# Patient Record
Sex: Female | Born: 1970 | Race: White | Hispanic: No | Smoking: Former smoker
Health system: Southern US, Community
[De-identification: ages and names within clinical notes are randomized; demographics above are authoritative.]

## PROBLEM LIST (undated history)

## (undated) DIAGNOSIS — Z8742 Personal history of other diseases of the female genital tract: Secondary | ICD-10-CM

## (undated) DIAGNOSIS — E079 Disorder of thyroid, unspecified: Secondary | ICD-10-CM

## (undated) DIAGNOSIS — F32A Depression, unspecified: Secondary | ICD-10-CM

## (undated) HISTORY — DX: Depression, unspecified: F32.A

---
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
## 2006-01-08 ENCOUNTER — Emergency Department: Payer: Self-pay | Admitting: Emergency Medicine

## 2012-06-05 LAB — HM HIV SCREENING LAB: HM HIV Screening: NEGATIVE

## 2013-03-16 ENCOUNTER — Emergency Department: Payer: Self-pay | Admitting: Emergency Medicine

## 2013-03-16 LAB — CBC WITH DIFFERENTIAL/PLATELET
Basophil #: 0.1 10*3/uL (ref 0.0–0.1)
Basophil %: 0.4 %
Eosinophil #: 0.1 10*3/uL (ref 0.0–0.7)
HGB: 15.3 g/dL (ref 12.0–16.0)
Lymphocyte %: 2.3 %
MCH: 29.4 pg (ref 26.0–34.0)
MCHC: 33.1 g/dL (ref 32.0–36.0)
MCV: 89 fL (ref 80–100)
Monocyte %: 4.3 %
Neutrophil %: 92.5 %
RBC: 5.2 10*6/uL (ref 3.80–5.20)
RDW: 13 % (ref 11.5–14.5)
WBC: 12.5 10*3/uL — ABNORMAL HIGH (ref 3.6–11.0)

## 2013-03-16 LAB — MONONUCLEOSIS SCREEN: Mono Test: POSITIVE

## 2013-03-17 LAB — BETA STREP CULTURE(ARMC)

## 2013-11-04 ENCOUNTER — Emergency Department: Payer: Self-pay | Admitting: Emergency Medicine

## 2015-02-10 ENCOUNTER — Emergency Department
Admission: EM | Admit: 2015-02-10 | Discharge: 2015-02-10 | Disposition: A | Discharge status: Home / Self Care | Payer: Self-pay | Attending: Emergency Medicine | Admitting: Emergency Medicine

## 2015-02-10 ENCOUNTER — Encounter: Payer: Self-pay | Admitting: Emergency Medicine

## 2015-02-10 DIAGNOSIS — L03116 Cellulitis of left lower limb: Secondary | ICD-10-CM | POA: Insufficient documentation

## 2015-02-10 DIAGNOSIS — Z720 Tobacco use: Secondary | ICD-10-CM | POA: Insufficient documentation

## 2015-02-10 MED ORDER — CEPHALEXIN 500 MG PO CAPS
500.0000 mg | ORAL_CAPSULE | Freq: Once | ORAL | Status: AC
  Administered 2015-02-10: 500 mg via ORAL

## 2015-02-10 MED ORDER — BACITRACIN 500 UNIT/GM EX OINT
1.0000 "application " | TOPICAL_OINTMENT | Freq: Two times a day (BID) | CUTANEOUS | Status: DC
  Administered 2015-02-10: 1 via TOPICAL

## 2015-02-10 MED ORDER — CEPHALEXIN 500 MG PO CAPS: 500.0000 mg | ORAL_CAPSULE | Freq: Two times a day (BID) | ORAL | Status: DC

## 2015-02-10 MED ORDER — BACITRACIN ZINC 500 UNIT/GM EX OINT
TOPICAL_OINTMENT | CUTANEOUS | Status: AC
  Administered 2015-02-10: 1 via TOPICAL
  Filled 2015-02-10: qty 0.9

## 2015-02-10 MED ORDER — DOUBLE ANTIBIOTIC 500-10000 UNIT/GM EX OINT: TOPICAL_OINTMENT | Freq: Two times a day (BID) | CUTANEOUS | Status: DC

## 2015-02-10 MED ORDER — CEPHALEXIN 500 MG PO CAPS
ORAL_CAPSULE | ORAL | Status: AC
  Administered 2015-02-10: 500 mg via ORAL
  Filled 2015-02-10: qty 1

## 2015-02-10 NOTE — ED Provider Notes (Signed)
[facility] Emergency Department Provider Note  ____________________________________________  Time seen: 4:15 AM  I have reviewed the triage vital signs and the nursing notes.   HISTORY  Chief Complaint Leg Pain      HPI Kristin Page is a 44 y.o. female presents with redness and swelling of tattoo on the left leg that was performed 4 days prior. Patient denies any fever     History reviewed. No pertinent past medical history.  There are no active problems to display for this patient.   History reviewed. No pertinent past surgical history.  No current outpatient prescriptions on file.  Allergies Review of patient's allergies indicates no known allergies.  No family history on file.  Social History History  Substance Use Topics  . Smoking status: Current Some Day Smoker  . Smokeless tobacco: Not on file  . Alcohol Use: No    Review of Systems  Constitutional: Negative for fever. Eyes: Negative for visual changes. ENT: Negative for sore throat. Cardiovascular: Negative for chest pain. Respiratory: Negative for shortness of breath. Gastrointestinal: Negative for abdominal pain, vomiting and diarrhea. Genitourinary: Negative for dysuria. Musculoskeletal: Negative for back pain. Skin: Positive for rash. Neurological: Negative for headaches, focal weakness or numbness.   10-point ROS otherwise negative.  ____________________________________________   PHYSICAL EXAM:  VITAL SIGNS: ED Triage Vitals  Enc Vitals Group     BP 02/10/15 0300 145/101 mmHg     Pulse Rate 02/10/15 0300 78     Resp 02/10/15 0300 18     Temp 02/10/15 0300 98.2 F (36.8 C)     Temp Source 02/10/15 0300 Oral     SpO2 02/10/15 0300 99 %     Weight 02/10/15 0300 312 lb (141.522 kg)     Height 02/10/15 0300 5' 5"  (1.651 m)     Head Cir --      Peak Flow --      Pain Score 02/10/15 0301 4     Pain Loc --      Pain Edu? --      Excl. in [place]? --      Constitutional: Alert and oriented. Well appearing and in no distress. Cardiovascular: Normal rate, regular rhythm. Normal and symmetric distal pulses are present in all extremities. No murmurs, rubs, or gallops. Respiratory: Normal respiratory effort without tachypnea nor retractions. Breath sounds are clear and equal bilaterally. No wheezes/rales/rhonchi. Gastrointestinal: Soft and nontender. No distention. There is no CVA tenderness. Genitourinary: deferred Musculoskeletal: Nontender with normal range of motion in all extremities. No joint effusions.  No lower extremity tenderness nor edema. Neurologic:  Normal speech and language. No gross focal neurologic deficits are appreciated. Speech is normal.   Skin:  Cellulitis of the left lateral distal leg. Edema watch erythema clear drainage Psychiatric: Mood and affect are normal. Speech and behavior are normal. Patient exhibits appropriate insight and judgment.  ____________________________________________       INITIAL IMPRESSION / ASSESSMENT AND PLAN / ED COURSE  Pertinent labs & imaging results that were available during my care of the patient were reviewed by me and considered in my medical decision making (see chart for details).  History and physical exam consistent with an infected tattoo. Keflex 500 mg given by mouth in emergency department  ____________________________________________   FINAL CLINICAL IMPRESSION(S) / ED DIAGNOSES  Final diagnoses:  Cellulitis of leg, left      Gregor Hams, MD 02/10/15 0425

## 2015-02-10 NOTE — ED Notes (Signed)
Patient ambulatory to triage with steady gait, without difficulty or distress noted; pt reports tattoo to left lateral outer leg on Saturday, noted Sunday color was bubbled up; Monday pants were rubbing site; using A&D ointment; but now redness/irritation persists

## 2015-02-10 NOTE — ED Notes (Signed)
Wound dressed per MD order. Pt educated on home care of wound.

## 2015-02-10 NOTE — ED Notes (Signed)
Pt alert and in NAD at time of d/c to husband.

## 2015-02-10 NOTE — Discharge Instructions (Signed)

## 2015-10-12 DIAGNOSIS — E669 Obesity, unspecified: Secondary | ICD-10-CM | POA: Insufficient documentation

## 2015-10-12 DIAGNOSIS — F17210 Nicotine dependence, cigarettes, uncomplicated: Secondary | ICD-10-CM | POA: Insufficient documentation

## 2015-10-12 DIAGNOSIS — Z792 Long term (current) use of antibiotics: Secondary | ICD-10-CM | POA: Insufficient documentation

## 2015-10-12 DIAGNOSIS — J988 Other specified respiratory disorders: Secondary | ICD-10-CM | POA: Insufficient documentation

## 2015-10-13 ENCOUNTER — Emergency Department
Admission: EM | Admit: 2015-10-13 | Discharge: 2015-10-13 | Disposition: A | Discharge status: Home / Self Care | Payer: Medicaid Other | Attending: Emergency Medicine | Admitting: Emergency Medicine

## 2015-10-13 ENCOUNTER — Encounter: Payer: Self-pay | Admitting: Emergency Medicine

## 2015-10-13 ENCOUNTER — Emergency Department: Payer: Self-pay

## 2015-10-13 DIAGNOSIS — J988 Other specified respiratory disorders: Secondary | ICD-10-CM

## 2015-10-13 DIAGNOSIS — R0981 Nasal congestion: Secondary | ICD-10-CM

## 2015-10-13 HISTORY — DX: Disorder of thyroid, unspecified: E07.9

## 2015-10-13 MED ORDER — BENZONATATE 100 MG PO CAPS: 100.0000 mg | ORAL_CAPSULE | Freq: Four times a day (QID) | ORAL | Status: AC | PRN

## 2015-10-13 MED ORDER — ALBUTEROL SULFATE HFA 108 (90 BASE) MCG/ACT IN AERS: INHALATION_SPRAY | RESPIRATORY_TRACT | Status: DC

## 2015-10-13 NOTE — ED Notes (Signed)
Pt presents to ED with c/o frequent productive cough and congestion for the past week. Pt reports that she has noticed her symptoms are getting progressively worse the past few days. Denies fever, n/v/d. Pt currently has no increased work of breathing or acute distress noted. Skin warm and dry.

## 2015-10-13 NOTE — Discharge Instructions (Signed)
You have been seen in the Emergency Department (ED) today for a likely viral illness.  Please drink plenty of clear fluids (water, Gatorade, chicken broth, etc).  You may use Tylenol and/or Motrin according to label instructions.  You can alternate between the two without any side effects.   Please follow up with your doctor as listed above.  Use any provided prescriptions according to the label instructions.  Call your doctor or return to the Emergency Department (ED) if you are unable to tolerate fluids due to vomiting, have worsening trouble breathing, become extremely tired or difficult to awaken, or if you develop any other symptoms that concern you.

## 2015-10-13 NOTE — ED Provider Notes (Signed)
[facility] Emergency Department Provider Note  ____________________________________________  Time seen: Approximately 4:45 AM  I have reviewed the triage vital signs and the nursing notes.   HISTORY  Chief Complaint Cough and Nasal Congestion    HPI Kristin Page is a 45 y.o. female with a past medical history that includes thyroid disease, obesity, and tobacco use who presents with a gradual onset of cough and nasal congestion for about one week.  She states that this happens to her about once a year at this time.  She feels short of breath particularly when she is coughing.  She denies fever/chills, chest pain, abdominal pain, nausea/vomiting.  Her cough is productive of a small amount of clear sputum.  She states that  got worse over a couple of days and she has noticed some wheezing as well which has also happened to her in the past.  She describes symptoms as moderate and nothing is making them better or worse.   Past Medical History  Diagnosis Date  . Thyroid disease     There are no active problems to display for this patient.   Past Surgical History  Procedure Laterality Date  . Cesarean section      Current Outpatient Rx  Name  Route  Sig  Dispense  Refill  . albuterol (PROVENTIL HFA;VENTOLIN HFA) 108 (90 Base) MCG/ACT inhaler      Inhale 2-4 puffs by mouth every 4 hours as needed for wheezing, cough, and/or shortness of breath   1 Inhaler   1   . benzonatate (TESSALON PERLES) 100 MG capsule   Oral   Take 1 capsule (100 mg total) by mouth every 6 (six) hours as needed for cough.   30 capsule   0   . cephALEXin (KEFLEX) 500 MG capsule   Oral   Take 1 capsule (500 mg total) by mouth 2 (two) times daily.   20 capsule   0     Allergies Review of patient's allergies indicates no known allergies.  No family history on file.  Social History Social History  Substance Use Topics  . Smoking status: Current Every Day Smoker --  0.50 packs/day    Types: Cigarettes  . Smokeless tobacco: Never Used  . Alcohol Use: No    Review of Systems Constitutional: No fever/chills Eyes: No visual changes. ENT: No sore throat.  Significant amount of sinus/nasal congestion and drainage Cardiovascular: Denies chest pain. Respiratory: Mild shortness of breath and wheezing.  Frequent minimally productive cough Gastrointestinal: No abdominal pain.  No nausea, no vomiting.  No diarrhea.  No constipation. Genitourinary: Negative for dysuria. Musculoskeletal: Negative for back pain. Skin: Negative for rash. Neurological: Mild intermittent headache.  No focal numbness or weakness  10-point ROS otherwise negative.  ____________________________________________   PHYSICAL EXAM:  VITAL SIGNS: ED Triage Vitals  Enc Vitals Group     BP 10/13/15 0025 130/73 mmHg     Pulse Rate 10/13/15 0025 83     Resp 10/13/15 0025 18     Temp 10/13/15 0025 98 F (36.7 C)     Temp Source 10/13/15 0025 Oral     SpO2 10/13/15 0025 97 %     Weight 10/13/15 0025 250 lb (113.399 kg)     Height 10/13/15 0025 5' 5"  (1.651 m)     Head Cir --      Peak Flow --      Pain Score 10/13/15 0026 8     Pain Loc --  Pain Edu? --      Excl. in [place]? --     Constitutional: Alert and oriented. Well appearing and in no acute distress. Eyes: Conjunctivae are normal. PERRL. EOMI. Head: Atraumatic. Nose: Significant amount of nasal congestion  Mouth/Throat: Mucous membranes are moist.  Oropharynx non-erythematous. Neck: No stridor.  No meningismus Cardiovascular: Normal rate, regular rhythm. Grossly normal heart sounds.  Good peripheral circulation. Respiratory: Normal respiratory effort.  No retractions.  Mild expiratory wheezing in all lung fields. Gastrointestinal: Obese.  Soft and nontender. No distention. No abdominal bruits. No CVA tenderness. Musculoskeletal: No lower extremity tenderness nor edema.  No joint effusions. Neurologic:  Normal speech  and language. No gross focal neurologic deficits are appreciated.  Skin:  Skin is warm, dry and intact. No rash noted. Psychiatric: Mood and affect are normal. Speech and behavior are normal.  ____________________________________________   LABS (all labs ordered are listed, but only abnormal results are displayed)  Labs Reviewed - No data to display ____________________________________________  EKG  None ____________________________________________  RADIOLOGY   Dg Chest 2 View  10/13/2015  CLINICAL DATA:  Acute onset of productive cough and congestion. Initial encounter. EXAM: CHEST  2 VIEW COMPARISON:  Chest radiograph performed 11/04/2013 FINDINGS: The lungs are well-aerated. Mild vascular congestion is noted. There is no evidence of focal opacification, pleural effusion or pneumothorax. The heart is normal in size; the mediastinal contour is within normal limits. No acute osseous abnormalities are seen. IMPRESSION: Mild vascular congestion noted.  Lungs remain grossly clear. Electronically Signed   By: Garald Balding M.D.   On: 10/13/2015 01:33    ____________________________________________   PROCEDURES  Procedure(s) performed: None  Critical Care performed: No ____________________________________________   INITIAL IMPRESSION / ASSESSMENT AND PLAN / ED COURSE  Pertinent labs & imaging results that were available during my care of the patient were reviewed by me and considered in my medical decision making (see chart for details).  Patient's signs and symptoms are consistent with a wheezing associated viral infection.  I am reluctant to give her steroids given that I am afraid this will lower her immune system and may worsen her infection.  However, I believe she would benefit from having an albuterol inhaler.  We discussed the use of a sinus rinse (neti pot), the role that viral infection plays with her symptoms rather than a bacterial infection.  I understand that her  chest x-ray suggested mild vascular congestion but I think that a viral pattern is much more likely in this patient as she has no reason to be volume overloaded.  I gave my usual and customary return precautions.      ____________________________________________  FINAL CLINICAL IMPRESSION(S) / ED DIAGNOSES  Final diagnoses:  Wheezing-associated respiratory infection  Nasal sinus congestion      NEW MEDICATIONS STARTED DURING THIS VISIT:  New Prescriptions   ALBUTEROL (PROVENTIL HFA;VENTOLIN HFA) 108 (90 BASE) MCG/ACT INHALER    Inhale 2-4 puffs by mouth every 4 hours as needed for wheezing, cough, and/or shortness of breath   BENZONATATE (TESSALON PERLES) 100 MG CAPSULE    Take 1 capsule (100 mg total) by mouth every 6 (six) hours as needed for cough.    Hinda Kehr, MD 10/13/15 [phone]

## 2015-10-13 NOTE — ED Notes (Addendum)
Pt with steady gait to room. Pt presents with cough and chest congestion x 1 week. Pt states she has felt SOB. Denies fever. Pt states "when I get to coughing sometimes it gets to burning". Pt denies fatigue, weakness. States small headache.

## 2016-08-14 DIAGNOSIS — L732 Hidradenitis suppurativa: Secondary | ICD-10-CM | POA: Insufficient documentation

## 2016-08-14 LAB — HM PAP SMEAR: HM Pap smear: NEGATIVE

## 2016-08-26 DIAGNOSIS — Z8742 Personal history of other diseases of the female genital tract: Secondary | ICD-10-CM

## 2016-08-26 HISTORY — DX: Personal history of other diseases of the female genital tract: Z87.42

## 2017-03-20 ENCOUNTER — Encounter: Payer: Self-pay | Admitting: Emergency Medicine

## 2017-03-20 DIAGNOSIS — H578 Other specified disorders of eye and adnexa: Secondary | ICD-10-CM | POA: Insufficient documentation

## 2017-03-20 DIAGNOSIS — Z5321 Procedure and treatment not carried out due to patient leaving prior to being seen by health care provider: Secondary | ICD-10-CM | POA: Insufficient documentation

## 2017-03-20 NOTE — ED Triage Notes (Signed)
Patient ambulatory to triage with steady gait, without difficulty or distress noted; pt reports ?allergies, c/o water, itchy eyes x 3 wks

## 2017-03-21 ENCOUNTER — Emergency Department
Admission: EM | Admit: 2017-03-21 | Discharge: 2017-03-21 | Disposition: A | Discharge status: Home / Self Care | Payer: Medicaid Other | Attending: Emergency Medicine | Admitting: Emergency Medicine

## 2019-02-12 ENCOUNTER — Emergency Department
Admission: EM | Admit: 2019-02-12 | Discharge: 2019-02-12 | Disposition: A | Discharge status: Home / Self Care | Payer: 59 | Attending: Emergency Medicine | Admitting: Emergency Medicine

## 2019-02-12 ENCOUNTER — Other Ambulatory Visit: Payer: Self-pay

## 2019-02-12 ENCOUNTER — Emergency Department: Payer: 59

## 2019-02-12 ENCOUNTER — Encounter: Payer: Self-pay | Admitting: Emergency Medicine

## 2019-02-12 DIAGNOSIS — Y939 Activity, unspecified: Secondary | ICD-10-CM | POA: Diagnosis not present

## 2019-02-12 DIAGNOSIS — F17210 Nicotine dependence, cigarettes, uncomplicated: Secondary | ICD-10-CM | POA: Diagnosis not present

## 2019-02-12 DIAGNOSIS — S239XXA Sprain of unspecified parts of thorax, initial encounter: Secondary | ICD-10-CM

## 2019-02-12 DIAGNOSIS — Y92410 Unspecified street and highway as the place of occurrence of the external cause: Secondary | ICD-10-CM | POA: Insufficient documentation

## 2019-02-12 DIAGNOSIS — Y999 Unspecified external cause status: Secondary | ICD-10-CM | POA: Insufficient documentation

## 2019-02-12 DIAGNOSIS — S39012A Strain of muscle, fascia and tendon of lower back, initial encounter: Secondary | ICD-10-CM | POA: Diagnosis not present

## 2019-02-12 DIAGNOSIS — S3992XA Unspecified injury of lower back, initial encounter: Secondary | ICD-10-CM | POA: Diagnosis present

## 2019-02-12 DIAGNOSIS — S29012A Strain of muscle and tendon of back wall of thorax, initial encounter: Secondary | ICD-10-CM | POA: Diagnosis not present

## 2019-02-12 LAB — POC URINE PREG, ED: Preg Test, Ur: NEGATIVE

## 2019-02-12 MED ORDER — CYCLOBENZAPRINE HCL 5 MG PO TABS: 5.0000 mg | ORAL_TABLET | Freq: Three times a day (TID) | ORAL | 0 refills | Status: DC | PRN
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
## 2019-04-20 DIAGNOSIS — L732 Hidradenitis suppurativa: Secondary | ICD-10-CM

## 2019-04-21 ENCOUNTER — Ambulatory Visit: Payer: Self-pay

## 2019-04-21 ENCOUNTER — Other Ambulatory Visit: Payer: Self-pay

## 2019-04-21 ENCOUNTER — Ambulatory Visit (LOCAL_COMMUNITY_HEALTH_CENTER): Payer: 59

## 2019-04-21 VITALS — BP 133/93 | Ht 65.0 in | Wt 300.4 lb

## 2019-04-21 DIAGNOSIS — Z30011 Encounter for initial prescription of contraceptive pills: Secondary | ICD-10-CM | POA: Diagnosis not present

## 2019-04-21 DIAGNOSIS — Z3009 Encounter for other general counseling and advice on contraception: Secondary | ICD-10-CM | POA: Diagnosis not present

## 2019-04-21 LAB — PREGNANCY, URINE: Preg Test, Ur: NEGATIVE

## 2019-04-21 MED ORDER — NORETHINDRONE 0.35 MG PO TABS: 1.0000 | ORAL_TABLET | Freq: Every day | ORAL | 3 refills | Status: DC

## 2019-04-21 MED ORDER — NORETHINDRONE 0.35 MG PO TABS: 1.0000 | ORAL_TABLET | Freq: Every day | ORAL | 11 refills | Status: DC

## 2019-04-21 NOTE — Progress Notes (Signed)
   [place] problem visit  [place] Department  Subjective:  Kristin Page is a 48 y.o. being seen today for   Chief Complaint  Patient presents with  . Contraception    HPI Client here for birth control  Has been on progestin pills in the past.  States that she and partner sometimes use condoms, last sex 04/16/2019 and LMP 03/16/2019.  Client states that her menses have become more irregular, thinks she may be going through menopause   Does the patient have a current or past history of drug use? No   No components found for: HCV]   Health Maintenance Due  Topic Date Due  . HIV Screening  06/19/1987  . TETANUS/TDAP  06/19/1991  . PAP SMEAR-Modifier  06/18/1993    Review of Systems  All other systems reviewed and are negative.   The following portions of the patient's history were reviewed and updated as appropriate: allergies, current medications, past family history, past medical history, past social history, past surgical history and problem list. Problem list updated.   See flowsheet for other program required questions.  Objective:   Vitals:   04/21/19 0917  BP: (!) 133/93  Weight: (!) 300 lb 6.4 oz (136.3 kg)  Height: 5' 5"  (1.651 m)    Physical Exam not done d/t covid.    Assessment and Plan:  Kristin Page is a 48 y.o. female presenting to the [facility] Department for a Women's Health problem visit  There are no diagnoses linked to this encounter. PT today.  IF negative may start POPs today. Co to do OTC PT in 1 wk. Micronor take 1 tablet daily # 3 packs.  Use condoms as back-up x 1 wk. Co to call when starts 4th pack to schedule to complete RP and/or continuation of POPs.   Client will continue her primary care with PCP for hypothyroid, blood pressure and weight management.    No follow-ups on file.  No future appointments.  Hassell Done, FNP

## 2019-04-21 NOTE — Progress Notes (Signed)
Here today to restart birth control. Desires Ocp's. Declines bloodwork and condoms today.  Hal Morales, RN
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
## 2020-02-09 ENCOUNTER — Ambulatory Visit: Payer: 59

## 2020-03-23 ENCOUNTER — Other Ambulatory Visit: Payer: Self-pay

## 2020-03-23 ENCOUNTER — Encounter: Payer: Self-pay | Admitting: Physician Assistant

## 2020-03-23 ENCOUNTER — Ambulatory Visit (LOCAL_COMMUNITY_HEALTH_CENTER): Payer: 59 | Admitting: Physician Assistant

## 2020-03-23 VITALS — BP 129/89 | Ht 65.0 in | Wt 304.0 lb

## 2020-03-23 DIAGNOSIS — Z3009 Encounter for other general counseling and advice on contraception: Secondary | ICD-10-CM

## 2020-03-23 DIAGNOSIS — Z30011 Encounter for initial prescription of contraceptive pills: Secondary | ICD-10-CM

## 2020-03-23 DIAGNOSIS — Z0000 Encounter for general adult medical examination without abnormal findings: Secondary | ICD-10-CM | POA: Diagnosis not present

## 2020-03-23 DIAGNOSIS — Z3041 Encounter for surveillance of contraceptive pills: Secondary | ICD-10-CM

## 2020-03-23 MED ORDER — NORETHINDRONE 0.35 MG PO TABS: 1.0000 | ORAL_TABLET | Freq: Every day | ORAL | 12 refills | Status: DC

## 2020-03-23 NOTE — Progress Notes (Signed)
Needs to get OCP rx filled at [facility] on [address] (other meds at [facility]), due to specific manufacturer. Has not missed pills or taken them late.

## 2020-03-24 ENCOUNTER — Encounter: Payer: Self-pay | Admitting: Physician Assistant

## 2020-03-24 NOTE — Progress Notes (Signed)
Family Planning Visit- Repeat Yearly Visit  Subjective:  Kristin Page is a 49 y.o. [phone]  being seen today for an well woman visit and to discuss family planning options.    She is currently using oral progesterone-only contraceptive for pregnancy prevention. Patient reports she does not want a pregnancy in the next year. Patient  has Hidradenitis suppurativa on their problem list.  Chief Complaint  Patient presents with  . Contraception    PE and OCs    Patient reports that she has had COVID and has been experiencing more frequent headaches since then.  Reports that she has run out of her thyroid medicine and needs an appointment with PCP to restart.  States that she has noticed in increase in her weight since she has been off of her medicine.  PHQ-9= 13 and patient denies S or H ideation or plan and states that it is due to stressors related to being out of work during [employer] and worrying about paying bills.  Declines LCSW referral but accepts LCSW and [employer] cards to use if changes mind.  Denies blurred vision, chest or arm pain and states that when she takes BP at her mother's house it is normal.   Patient denies any changes to her personal or family history or other concerns except as noted above.   See flowsheet for other program required questions.   Body mass index is 50.59 kg/m. - Patient is eligible for diabetes screening based on BMI and age >16?  yes HA1C ordered? No, patient declines and states that her PCP will do this test.  Patient reports 1 of partners in last year. Desires STI screening?  No - declines.   Has patient been screened once for HCV in the past?  No  No results found for: HCVAB  Does the patient have current of drug use, have a partner with drug use, and/or has been incarcerated since last result? No  If yes-- Screen for HCV through [facility]   Does the patient meet criteria for HBV testing? No  Criteria:  -Household, sexual or needle sharing  contact with HBV -History of drug use -HIV positive -Those with known Hep C   Health Maintenance Due  Topic Date Due  . Hepatitis C Screening  Never done  . COVID-19 Vaccine (1) Never done  . TETANUS/TDAP  Never done  . PAP SMEAR-Modifier  08/15/2019    Review of Systems  All other systems reviewed and are negative.   The following portions of the patient's history were reviewed and updated as appropriate: allergies, current medications, past family history, past medical history, past social history, past surgical history and problem list. Problem list updated.  Objective:   Vitals:   03/23/20 1312 03/23/20 1315  BP: (!) 154/97 129/89  Weight: (!) 304 lb (137.9 kg)   Height: 5' 5"  (1.651 m)     Physical Exam Vitals and nursing note reviewed.  Constitutional:      General: She is not in acute distress.    Appearance: Normal appearance. She is obese.  HENT:     Head: Normocephalic and atraumatic.  Eyes:     Conjunctiva/sclera: Conjunctivae normal.  Neck:     Thyroid: No thyroid mass, thyromegaly or thyroid tenderness.  Cardiovascular:     Rate and Rhythm: Normal rate and regular rhythm.  Pulmonary:     Effort: Pulmonary effort is normal.     Breath sounds: Normal breath sounds.  Chest:     Breasts:  Right: Normal.        Left: Normal.  Abdominal:     Palpations: Abdomen is soft. There is no mass.     Tenderness: There is no abdominal tenderness. There is no guarding or rebound.  Musculoskeletal:     Cervical back: Neck supple. No tenderness.  Lymphadenopathy:     Cervical: No cervical adenopathy.     Upper Body:     Right upper body: No supraclavicular, axillary or pectoral adenopathy.     Left upper body: No supraclavicular, axillary or pectoral adenopathy.  Neurological:     Mental Status: She is alert and oriented to person, place, and time.  Psychiatric:        Mood and Affect: Mood normal.        Behavior: Behavior normal.        Thought  Content: Thought content normal.        Judgment: Judgment normal.       Assessment and Plan:  Kristin Page is a 49 y.o. female [phone] presenting to the [facility] Department for an yearly well woman exam/family planning visit  Contraception counseling: Reviewed all forms of birth control options in the tiered based approach. available including abstinence; over the counter/barrier methods; hormonal contraceptive medication including pill, patch, ring, injection,contraceptive implant, ECP; hormonal and nonhormonal IUDs; permanent sterilization options including vasectomy and the various tubal sterilization modalities. Risks, benefits, and typical effectiveness rates were reviewed.  Questions were answered.  Written information was also given to the patient to review.  Patient desires to continue with her current OC, this was prescribed for patient. She will follow up in  1 year and prn for surveillance.  She was told to call with any further questions, or with any concerns about this method of contraception.  Emphasized use of condoms 100% of the time for STI prevention.  Patient was not a candidate for ECP today.   1. Encounter for counseling regarding contraception Counseled patient when to call clinic with concerns about SE from OCs. Rec condoms with all sex.  2. Well woman exam (no gynecological exam) Reviewed with patient healthy habits to lose weight. Rec MVI 1 po daily. Enc to follow up with/establish with PCP for follow up on thyroid disorder, elevated BP, headaches, illness and age appropriate screenings. Patient give LCSW and [employer] cards.  3. Surveillance of previously prescribed contraceptive pill E-Rx for Norethindrone 28d 1 po daily sent to patient's pharmacy of choice. - norethindrone (MICRONOR) 0.35 MG tablet; Take 1 tablet (0.35 mg total) by mouth daily.  Dispense: 28 tablet; Refill: 12   Patient left clinic after being given cards and did not stay  for posting by RN.  No follow-ups on file.  No future appointments.  Jerene Dilling, PA

## 2020-07-27 ENCOUNTER — Telehealth: Payer: Self-pay

## 2020-07-27 NOTE — Telephone Encounter (Signed)
Copied from [place] [id number]. Topic: Appointment Scheduling - New Patient >> Jul 27, 2020  9:35 AM Lennox Solders wrote: New patient has been scheduled for your office. Provider: Fabio Bering Date of Appointment:08-09-2020  Route to department's PEC pool.

## 2020-08-09 ENCOUNTER — Encounter: Payer: Self-pay | Admitting: Physician Assistant

## 2020-08-09 ENCOUNTER — Other Ambulatory Visit: Payer: Self-pay

## 2020-08-09 ENCOUNTER — Ambulatory Visit (INDEPENDENT_AMBULATORY_CARE_PROVIDER_SITE_OTHER): Payer: 59 | Admitting: Physician Assistant

## 2020-08-09 VITALS — BP 121/85 | HR 75 | Temp 98.3°F | Ht 65.0 in | Wt 323.3 lb

## 2020-08-09 DIAGNOSIS — R7303 Prediabetes: Secondary | ICD-10-CM

## 2020-08-09 DIAGNOSIS — E785 Hyperlipidemia, unspecified: Secondary | ICD-10-CM

## 2020-08-09 DIAGNOSIS — E039 Hypothyroidism, unspecified: Secondary | ICD-10-CM

## 2020-08-09 DIAGNOSIS — R87619 Unspecified abnormal cytological findings in specimens from cervix uteri: Secondary | ICD-10-CM

## 2020-08-09 DIAGNOSIS — F32A Depression, unspecified: Secondary | ICD-10-CM

## 2020-08-09 DIAGNOSIS — R0981 Nasal congestion: Secondary | ICD-10-CM

## 2020-08-09 DIAGNOSIS — Z114 Encounter for screening for human immunodeficiency virus [HIV]: Secondary | ICD-10-CM

## 2020-08-09 DIAGNOSIS — T485X5A Adverse effect of other anti-common-cold drugs, initial encounter: Secondary | ICD-10-CM

## 2020-08-09 DIAGNOSIS — Z1159 Encounter for screening for other viral diseases: Secondary | ICD-10-CM

## 2020-08-09 DIAGNOSIS — R29818 Other symptoms and signs involving the nervous system: Secondary | ICD-10-CM

## 2020-08-09 DIAGNOSIS — G5603 Carpal tunnel syndrome, bilateral upper limbs: Secondary | ICD-10-CM

## 2020-08-09 MED ORDER — PREDNISONE 20 MG PO TABS: ORAL_TABLET | ORAL | 0 refills | Status: DC
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
## 2020-08-09 NOTE — Patient Instructions (Signed)
Health Maintenance, Female Adopting a healthy lifestyle and getting preventive care are important in promoting health and wellness. Ask your health care provider about:  The right schedule for you to have regular tests and exams.  Things you can do on your own to prevent diseases and keep yourself healthy. What should I know about diet, weight, and exercise? Eat a healthy diet   Eat a diet that includes plenty of vegetables, fruits, low-fat dairy products, and lean protein.  Do not eat a lot of foods that are high in solid fats, added sugars, or sodium. Maintain a healthy weight Body mass index (BMI) is used to identify weight problems. It estimates body fat based on height and weight. Your health care provider can help determine your BMI and help you achieve or maintain a healthy weight. Get regular exercise Get regular exercise. This is one of the most important things you can do for your health. Most adults should:  Exercise for at least 150 minutes each week. The exercise should increase your heart rate and make you sweat (moderate-intensity exercise).  Do strengthening exercises at least twice a week. This is in addition to the moderate-intensity exercise.  Spend less time sitting. Even light physical activity can be beneficial. Watch cholesterol and blood lipids Have your blood tested for lipids and cholesterol at 48 years of age, then have this test every 5 years. Have your cholesterol levels checked more often if:  Your lipid or cholesterol levels are high.  You are older than 48 years of age.  You are at high risk for heart disease. What should I know about cancer screening? Depending on your health history and family history, you may need to have cancer screening at various ages. This may include screening for:  Breast cancer.  Cervical cancer.  Colorectal cancer.  Skin cancer.  Lung cancer. What should I know about heart disease, diabetes, and high blood  pressure? Blood pressure and heart disease  High blood pressure causes heart disease and increases the risk of stroke. This is more likely to develop in people who have high blood pressure readings, are of African descent, or are overweight.  Have your blood pressure checked: ? Every 3-5 years if you are 18-39 years of age. ? Every year if you are 40 years old or older. Diabetes Have regular diabetes screenings. This checks your fasting blood sugar level. Have the screening done:  Once every three years after age 40 if you are at a normal weight and have a low risk for diabetes.  More often and at a younger age if you are overweight or have a high risk for diabetes. What should I know about preventing infection? Hepatitis B If you have a higher risk for hepatitis B, you should be screened for this virus. Talk with your health care provider to find out if you are at risk for hepatitis B infection. Hepatitis C Testing is recommended for:  Everyone born from 1945 through 1965.  Anyone with known risk factors for hepatitis C. Sexually transmitted infections (STIs)  Get screened for STIs, including gonorrhea and chlamydia, if: ? You are sexually active and are younger than 48 years of age. ? You are older than 48 years of age and your health care provider tells you that you are at risk for this type of infection. ? Your sexual activity has changed since you were last screened, and you are at increased risk for chlamydia or gonorrhea. Ask your health care provider if   you are at risk.  Ask your health care provider about whether you are at high risk for HIV. Your health care provider may recommend a prescription medicine to help prevent HIV infection. If you choose to take medicine to prevent HIV, you should first get tested for HIV. You should then be tested every 3 months for as long as you are taking the medicine. Pregnancy  If you are about to stop having your period (premenopausal) and  you may become pregnant, seek counseling before you get pregnant.  Take 400 to 800 micrograms (mcg) of folic acid every day if you become pregnant.  Ask for birth control (contraception) if you want to prevent pregnancy. Osteoporosis and menopause Osteoporosis is a disease in which the bones lose minerals and strength with aging. This can result in bone fractures. If you are 65 years old or older, or if you are at risk for osteoporosis and fractures, ask your health care provider if you should:  Be screened for bone loss.  Take a calcium or vitamin D supplement to lower your risk of fractures.  Be given hormone replacement therapy (HRT) to treat symptoms of menopause. Follow these instructions at home: Lifestyle  Do not use any products that contain nicotine or tobacco, such as cigarettes, e-cigarettes, and chewing tobacco. If you need help quitting, ask your health care provider.  Do not use street drugs.  Do not share needles.  Ask your health care provider for help if you need support or information about quitting drugs. Alcohol use  Do not drink alcohol if: ? Your health care provider tells you not to drink. ? You are pregnant, may be pregnant, or are planning to become pregnant.  If you drink alcohol: ? Limit how much you use to 0-1 drink a day. ? Limit intake if you are breastfeeding.  Be aware of how much alcohol is in your drink. In the [place] one drink equals one 12 oz bottle of beer (355 mL), one 5 oz glass of wine (148 mL), or one 1 oz glass of hard liquor (44 mL). General instructions  Schedule regular health, dental, and eye exams.  Stay current with your vaccines.  Tell your health care provider if: ? You often feel depressed. ? You have ever been abused or do not feel safe at home. Summary  Adopting a healthy lifestyle and getting preventive care are important in promoting health and wellness.  Follow your health care provider's instructions about healthy  diet, exercising, and getting tested or screened for diseases.  Follow your health care provider's instructions on monitoring your cholesterol and blood pressure. This information is not intended to replace advice given to you by your health care provider. Make sure you discuss any questions you have with your health care provider. Document Revised: 09/03/2018 Document Reviewed: 09/03/2018 Elsevier Patient Education  2020 [employer]vier [employer]  

## 2020-08-10 ENCOUNTER — Telehealth: Payer: Self-pay | Admitting: Physician Assistant

## 2020-08-10 DIAGNOSIS — E785 Hyperlipidemia, unspecified: Secondary | ICD-10-CM | POA: Insufficient documentation

## 2020-08-10 DIAGNOSIS — R29818 Other symptoms and signs involving the nervous system: Secondary | ICD-10-CM | POA: Insufficient documentation

## 2020-08-10 DIAGNOSIS — G5603 Carpal tunnel syndrome, bilateral upper limbs: Secondary | ICD-10-CM | POA: Insufficient documentation

## 2020-08-10 DIAGNOSIS — E039 Hypothyroidism, unspecified: Secondary | ICD-10-CM | POA: Insufficient documentation

## 2020-08-10 DIAGNOSIS — R7303 Prediabetes: Secondary | ICD-10-CM | POA: Insufficient documentation

## 2020-08-10 DIAGNOSIS — R87619 Unspecified abnormal cytological findings in specimens from cervix uteri: Secondary | ICD-10-CM | POA: Insufficient documentation

## 2020-08-10 DIAGNOSIS — F32A Depression, unspecified: Secondary | ICD-10-CM | POA: Insufficient documentation

## 2020-08-10 LAB — COMPREHENSIVE METABOLIC PANEL
ALT: 14 IU/L (ref 0–32)
AST: 39 IU/L (ref 0–40)
Albumin/Globulin Ratio: 1.4 (ref 1.2–2.2)
Albumin: 4.4 g/dL (ref 3.8–4.8)
Alkaline Phosphatase: 55 IU/L (ref 44–121)
BUN/Creatinine Ratio: 8 — ABNORMAL LOW (ref 9–23)
BUN: 12 mg/dL (ref 6–24)
Bilirubin Total: 0.3 mg/dL (ref 0.0–1.2)
CO2: 25 mmol/L (ref 20–29)
Calcium: 9.3 mg/dL (ref 8.7–10.2)
Chloride: 99 mmol/L (ref 96–106)
Creatinine, Ser: 1.43 mg/dL — ABNORMAL HIGH (ref 0.57–1.00)
GFR calc Af Amer: 50 mL/min/{1.73_m2} — ABNORMAL LOW (ref >59)
GFR calc non Af Amer: 43 mL/min/{1.73_m2} — ABNORMAL LOW (ref >59)
Globulin, Total: 3.1 g/dL (ref 1.5–4.5)
Glucose: 94 mg/dL (ref 65–99)
Potassium: 4.6 mmol/L (ref 3.5–5.2)
Sodium: 139 mmol/L (ref 134–144)
Total Protein: 7.5 g/dL (ref 6.0–8.5)

## 2020-08-10 LAB — CBC WITH DIFFERENTIAL/PLATELET
Basophils Absolute: 0.1 10*3/uL (ref 0.0–0.2)
Basos: 1 %
EOS (ABSOLUTE): 0.4 10*3/uL (ref 0.0–0.4)
Eos: 5 %
Hematocrit: 37.4 % (ref 34.0–46.6)
Hemoglobin: 12.6 g/dL (ref 11.1–15.9)
Immature Grans (Abs): 0 10*3/uL (ref 0.0–0.1)
Immature Granulocytes: 0 %
Lymphocytes Absolute: 2.2 10*3/uL (ref 0.7–3.1)
Lymphs: 27 %
MCH: 31.8 pg (ref 26.6–33.0)
MCHC: 33.7 g/dL (ref 31.5–35.7)
MCV: 94 fL (ref 79–97)
Monocytes Absolute: 0.5 10*3/uL (ref 0.1–0.9)
Monocytes: 6 %
Neutrophils Absolute: 4.7 10*3/uL (ref 1.4–7.0)
Neutrophils: 61 %
Platelets: 340 10*3/uL (ref 150–450)
RBC: 3.96 x10E6/uL (ref 3.77–5.28)
RDW: 13.2 % (ref 11.7–15.4)
WBC: 7.8 10*3/uL (ref 3.4–10.8)

## 2020-08-10 LAB — HEMOGLOBIN A1C
Est. average glucose Bld gHb Est-mCnc: 126 mg/dL
Hgb A1c MFr Bld: 6 % — ABNORMAL HIGH (ref 4.8–5.6)

## 2020-08-10 LAB — LIPID PANEL
Chol/HDL Ratio: 7 ratio — ABNORMAL HIGH (ref 0.0–4.4)
Cholesterol, Total: 308 mg/dL — ABNORMAL HIGH (ref 100–199)
HDL: 44 mg/dL (ref >39)
LDL Chol Calc (NIH): 232 mg/dL — ABNORMAL HIGH (ref 0–99)
Triglycerides: 166 mg/dL — ABNORMAL HIGH (ref 0–149)
VLDL Cholesterol Cal: 32 mg/dL (ref 5–40)

## 2020-08-10 LAB — TSH: TSH: 152 u[IU]/mL — ABNORMAL HIGH (ref 0.450–4.500)

## 2020-08-10 LAB — HEPATITIS C ANTIBODY: Hep C Virus Ab: <0.1 s/co ratio (ref 0.0–0.9)

## 2020-08-10 LAB — HIV ANTIBODY (ROUTINE TESTING W REFLEX): HIV Screen 4th Generation wRfx: NONREACTIVE

## 2020-08-10 MED ORDER — LEVOTHYROXINE SODIUM 150 MCG PO TABS: 150.0000 ug | ORAL_TABLET | Freq: Every day | ORAL | 0 refills | Status: DC

## 2020-08-10 MED ORDER — LEVOTHYROXINE SODIUM 50 MCG PO TABS: 50.0000 ug | ORAL_TABLET | Freq: Every day | ORAL | 0 refills | Status: DC

## 2020-08-10 NOTE — Telephone Encounter (Signed)
I accidentally send in 50 mcg synthroid instead of 150 mcg. I have sent in the correct 150 mcg dose so can we please call and cancel the 50 mcg dose? Thank you.

## 2020-08-10 NOTE — Telephone Encounter (Signed)
Called pharmacy and cancelled synthroid 50 mcg.

## 2020-09-28 NOTE — Progress Notes (Signed)
Complete physical exam   Patient: Kristin Page   DOB: 1970/12/18   50 y.o. Female  MRN: [id number] Visit Date: 09/29/2020  Today's healthcare provider: Trinna Post, PA-C   Chief Complaint  Patient presents with  . Annual Exam  . Hypothyroidism  I,Gordie Crumby M Jona Erkkila,acting as a scribe for Trinna Post, PA-C.,have documented all relevant documentation on the behalf of Trinna Post, PA-C,as directed by  Trinna Post, PA-C while in the presence of Trinna Post, PA-C.  Subjective    [name] is a 50 y.o. female who presents today for a complete physical exam.  She reports consuming a general diet. The patient has a physically strenuous job, but has no regular exercise apart from work.  She generally feels well. She reports sleeping fairly well. She does have additional problems to discuss today.  HPI   In the interim, reports her mom has passed away of an infection.  She continues to use Afrin nasal spray.   Patient also notes that her sexual partner was positive for gonorrhea and alerted her of this recently. She does not have any additional sexual partners. She reports they engage in mostly oral sex .She had a sore throat earlier this year but was seen at urgent care and was strep positive. She was treated with amoxicillin with good resolution of symptoms. She does not have any vaginal discharge, pain, of bleeding. She is interested in STI testing.   Hypothyroid, follow-up  Lab Results  Component Value Date   TSH 4.330 09/29/2020   TSH 152.000 (H) 08/09/2020   Wt Readings from Last 3 Encounters:  09/29/20 295 lb (133.8 kg)  08/09/20 (!) 323 lb 4.8 oz (146.6 kg)  03/23/20 (!) 304 lb (137.9 kg)    She was last seen for hypothyroid 2 months ago.  Management since that visit includes started Synthroid 150 MCG daily. She reports good compliance with treatment. She is not having side effects. She reports her condition has improved.    Symptoms: No change in energy level No constipation  No diarrhea No heat / cold intolerance  No nervousness No palpitations  No weight changes    -----------------------------------------------------------------------------------------   Past Medical History:  Diagnosis Date  . History of abnormal cervical Pap smear 08/26/2016  . Thyroid disease    Past Surgical History:  Procedure Laterality Date  . CESAREAN SECTION     Social History   Socioeconomic History  . Marital status: Divorced    Spouse name: Not on file  . Number of children: Not on file  . Years of education: Not on file  . Highest education level: Not on file  Occupational History  . Not on file  Tobacco Use  . Smoking status: Former Smoker    Packs/day: 0.50    Types: Cigarettes    Quit date: 12/24/2018    Years since quitting: 1.7  . Smokeless tobacco: Never Used  Vaping Use  . Vaping Use: Former  . Devices: used to help her stop smoking  Substance and Sexual Activity  . Alcohol use: Yes    Comment: occasionally  . Drug use: Never  . Sexual activity: Yes    Birth control/protection: Pill  Other Topics Concern  . Not on file  Social History Narrative  . Not on file   Social Determinants of Health   Financial Resource Strain: Not on file  Food Insecurity: Not on file  Transportation Needs: Not on file  Physical Activity:  Not on file  Stress: Not on file  Social Connections: Not on file  Intimate Partner Violence: Not At Risk  . Fear of Current or Ex-Partner: No  . Emotionally Abused: No  . Physically Abused: No  . Sexually Abused: No   Family Status  Relation Name Status  . Mother  Alive  . Father  Deceased  . MGM  Deceased  . MGF  Deceased  . PGF unknown (Not Specified)  . PGM unknown (Not Specified)  . Son  Alive  . Daughter  Alive  . Daughter  Alive  . H Brother  Alive  . H Sister  Alive   Family History  Problem Relation Age of Onset  . Heart disease Mother   .  Depression Mother   . Cervical cancer Mother   . Multiple sclerosis Mother   . Anxiety disorder Mother   . Heart disease Father   . Hypertension Maternal Grandmother   . Parkinson's disease Maternal Grandmother   . Dementia Maternal Grandmother   . Hypertension Maternal Grandfather   . Stroke Maternal Grandfather   . ADD / ADHD Son   . ADD / ADHD Daughter    No Known Allergies  Patient Care Team: Paulene Floor as PCP - General (Physician Assistant)   Medications: Outpatient Medications Prior to Visit  Medication Sig  . cholecalciferol (VITAMIN D3) 25 MCG (1000 UNIT) tablet Take 5,000 Units by mouth daily.  . fexofenadine (ALLEGRA) 180 MG tablet Take 180 mg by mouth daily.  Marland Kitchen levothyroxine (SYNTHROID) 150 MCG tablet Take 1 tablet (150 mcg total) by mouth daily.  . Multiple Vitamins-Minerals (WOMENS MULTIVITAMIN PO) Take 1 tablet by mouth 1 day or 1 dose.  . norethindrone (MICRONOR) 0.35 MG tablet Take 1 tablet (0.35 mg total) by mouth daily.  . norethindrone (ORTHO MICRONOR) 0.35 MG tablet Take 1 tablet (0.35 mg total) by mouth daily.  . predniSONE (DELTASONE) 20 MG tablet Take 6 pills on day 1, 5 pills on day 2 and so on until complete. (Patient not taking: Reported on 09/29/2020)   No facility-administered medications prior to visit.    Review of Systems  Constitutional: Positive for fatigue.  HENT: Negative.   Eyes: Negative.   Respiratory: Negative.   Cardiovascular: Negative.   Gastrointestinal: Negative.   Endocrine: Negative.   Genitourinary: Negative.   Musculoskeletal: Positive for arthralgias, back pain and joint swelling.  Skin: Negative.   Allergic/Immunologic: Negative.   Neurological: Negative.   Hematological: Negative.   Psychiatric/Behavioral: Negative.       Objective    BP 132/85 (BP Location: Left Arm, Patient Position: Sitting, Cuff Size: Large)   Pulse 87   Temp 98.3 F (36.8 C) (Oral)   Ht 5' 5"  (1.651 m)   Wt 295 lb (133.8 kg)    LMP 09/09/2020 (Exact Date)   SpO2 100%   BMI 49.09 kg/m    Physical Exam Constitutional:      Appearance: Normal appearance.  HENT:     Right Ear: Tympanic membrane, ear canal and external ear normal.     Left Ear: Tympanic membrane, ear canal and external ear normal.  Cardiovascular:     Rate and Rhythm: Normal rate and regular rhythm.     Pulses: Normal pulses.     Heart sounds: Normal heart sounds.  Pulmonary:     Effort: Pulmonary effort is normal.     Breath sounds: Normal breath sounds.  Abdominal:     General: Abdomen is flat.  Bowel sounds are normal.     Palpations: Abdomen is soft.  Genitourinary:    Vagina: Normal.     Cervix: Normal.  Skin:    General: Skin is warm and dry.  Neurological:     General: No focal deficit present.     Mental Status: She is alert and oriented to person, place, and time.  Psychiatric:        Mood and Affect: Mood normal.        Behavior: Behavior normal.       Last depression screening scores PHQ 2/9 Scores 09/29/2020 08/09/2020 03/23/2020  PHQ - 2 Score 2 2 4   PHQ- 9 Score 4 6 13    Last fall risk screening Fall Risk  09/29/2020  Falls in the past year? 0  Number falls in past yr: 0  Injury with Fall? 0  Risk for fall due to : No Fall Risks  Follow up Falls evaluation completed   Last Audit-C alcohol use screening Alcohol Use Disorder Test (AUDIT) 09/29/2020  1. How often do you have a drink containing alcohol? 3  2. How many drinks containing alcohol do you have on a typical day when you are drinking? 0  3. How often do you have six or more drinks on one occasion? 0  AUDIT-C Score 3  4. How often during the last year have you found that you were not able to stop drinking once you had started? 0  5. How often during the last year have you failed to do what was normally expected from you because of drinking? 0  6. How often during the last year have you needed a first drink in the morning to get yourself going after a heavy  drinking session? 0  7. How often during the last year have you had a feeling of guilt of remorse after drinking? 0  8. How often during the last year have you been unable to remember what happened the night before because you had been drinking? 0  9. Have you or someone else been injured as a result of your drinking? 0  10. Has a relative or friend or a doctor or another health worker been concerned about your drinking or suggested you cut down? 0  Alcohol Use Disorder Identification Test Final Score (AUDIT) 3   A score of 3 or more in women, and 4 or more in men indicates increased risk for alcohol abuse, EXCEPT if all of the points are from question 1   Results for orders placed or performed in visit on 09/29/20  TSH  Result Value Ref Range   TSH 4.330 0.450 - 4.500 uIU/mL  Molecular Ancillary Only  Result Value Ref Range   Neisseria Gonorrhea Negative    Chlamydia Negative    Comment Normal Reference Ranger Chlamydia - Negative    Comment      Normal Reference Range Neisseria Gonorrhea - Negative    Assessment & Plan    Routine Health Maintenance and Physical Exam  Exercise Activities and Dietary recommendations Goals   None      There is no immunization history on file for this patient.  Health Maintenance  Topic Date Due  . TETANUS/TDAP  Never done  . COLONOSCOPY (Pts 45-2yr Insurance coverage will need to be confirmed)  Never done  . COVID-19 Vaccine (1) 10/15/2020 (Originally 06/18/1976)  . INFLUENZA VACCINE  12/22/2020 (Originally 04/24/2020)  . PAP SMEAR-Modifier  08/14/2021  . Hepatitis C Screening  Completed  .  HIV Screening  Completed    Discussed health benefits of physical activity, and encouraged her to engage in regular exercise appropriate for her age and condition.  1. Annual physical exam   2. Hypothyroidism, unspecified type  TSH now within normal range, continue synthroid 150 mg daily.   - TSH  3. Depression, unspecified depression  type  She reports this has improved, does not want to pursue counseling currently.   4. Morbid obesity ([place])  Has lost 30 lbs since starting synthroid.   5. Need for tetanus booster   6. Nasal congestion due to prolonged use of decongestants  Continues to use afrin nasal spray. Counseled on risks of this.  7. Colon cancer screening  - Cologuard  8. Exposure to gonorrhea  - GC/Chlamydia Probe Amp  9. Cervical cancer screening  - Cytology - PAP   No follow-ups on file.     ITrinna Post, PA-C, have reviewed all documentation for this visit. The documentation on 10/04/20 for the exam, diagnosis, procedures, and orders are all accurate and complete.  The entirety of the information documented in the History of Present Illness, Review of Systems and Physical Exam were personally obtained by me. Portions of this information were initially documented by [facility] and reviewed by me for thoroughness and accuracy.     Paulene Floor  [facility] [phone] (phone) [phone] (fax)  [place]

## 2020-09-29 ENCOUNTER — Ambulatory Visit (INDEPENDENT_AMBULATORY_CARE_PROVIDER_SITE_OTHER): Payer: 59 | Admitting: Physician Assistant

## 2020-09-29 ENCOUNTER — Encounter: Payer: Self-pay | Admitting: Physician Assistant

## 2020-09-29 ENCOUNTER — Other Ambulatory Visit: Payer: Self-pay

## 2020-09-29 ENCOUNTER — Other Ambulatory Visit (HOSPITAL_COMMUNITY)
Admission: RE | Admit: 2020-09-29 | Discharge: 2020-09-29 | Disposition: A | Discharge status: Home / Self Care | Payer: 59 | Source: Ambulatory Visit | Attending: Physician Assistant | Admitting: Physician Assistant

## 2020-09-29 VITALS — BP 132/85 | HR 87 | Temp 98.3°F | Ht 65.0 in | Wt 295.0 lb

## 2020-09-29 DIAGNOSIS — R0981 Nasal congestion: Secondary | ICD-10-CM | POA: Diagnosis not present

## 2020-09-29 DIAGNOSIS — Z0000 Encounter for general adult medical examination without abnormal findings: Secondary | ICD-10-CM | POA: Diagnosis not present

## 2020-09-29 DIAGNOSIS — Z124 Encounter for screening for malignant neoplasm of cervix: Secondary | ICD-10-CM | POA: Diagnosis not present

## 2020-09-29 DIAGNOSIS — Z1211 Encounter for screening for malignant neoplasm of colon: Secondary | ICD-10-CM

## 2020-09-29 DIAGNOSIS — Z202 Contact with and (suspected) exposure to infections with a predominantly sexual mode of transmission: Secondary | ICD-10-CM

## 2020-09-29 DIAGNOSIS — F32A Depression, unspecified: Secondary | ICD-10-CM

## 2020-09-29 DIAGNOSIS — E039 Hypothyroidism, unspecified: Secondary | ICD-10-CM | POA: Diagnosis not present

## 2020-09-29 DIAGNOSIS — T485X5A Adverse effect of other anti-common-cold drugs, initial encounter: Secondary | ICD-10-CM

## 2020-09-29 DIAGNOSIS — Z23 Encounter for immunization: Secondary | ICD-10-CM

## 2020-09-30 ENCOUNTER — Telehealth: Payer: Self-pay

## 2020-09-30 DIAGNOSIS — Z7251 High risk heterosexual behavior: Secondary | ICD-10-CM

## 2020-09-30 LAB — TSH: TSH: 4.33 u[IU]/mL (ref 0.450–4.500)

## 2020-09-30 NOTE — Telephone Encounter (Signed)
Returned Peter Congo w/ [facility] call and advised her that the pap order was correct and she gave me the order number for the swab: [id number].

## 2020-09-30 NOTE — Telephone Encounter (Signed)
Copied from [place] [id number]. Topic: General - Inquiry >> Sep 30, 2020  9:59 AM Scherrie Gerlach wrote: Reason for CRM: Peter Congo with [facility] calling to ask about the swab sent for the pt. Also another issue was the order was put under [facility] and needs to be [facility]. Peter Congo requesting a call back asap [phone]

## 2020-09-30 NOTE — Telephone Encounter (Signed)
Order placed

## 2020-10-03 LAB — MOLECULAR ANCILLARY ONLY
Chlamydia: NEGATIVE
Comment: NEGATIVE
Comment: NORMAL
Neisseria Gonorrhea: NEGATIVE

## 2020-10-05 LAB — CYTOLOGY - PAP
Chlamydia: NEGATIVE
Comment: NEGATIVE
Comment: NEGATIVE
Comment: NEGATIVE
Comment: NORMAL
Diagnosis: UNDETERMINED — AB
High risk HPV: NEGATIVE
Neisseria Gonorrhea: NEGATIVE
Trichomonas: NEGATIVE

## 2020-11-04 ENCOUNTER — Other Ambulatory Visit: Payer: Self-pay | Admitting: Physician Assistant

## 2020-11-04 DIAGNOSIS — E039 Hypothyroidism, unspecified: Secondary | ICD-10-CM

## 2020-11-05 LAB — EXTERNAL GENERIC LAB PROCEDURE: COLOGUARD: NEGATIVE

## 2020-11-05 LAB — COLOGUARD
COLOGUARD: NEGATIVE
Cologuard: NEGATIVE

## 2021-01-27 ENCOUNTER — Encounter: Payer: Self-pay | Admitting: Physician Assistant

## 2021-01-27 ENCOUNTER — Other Ambulatory Visit: Payer: Self-pay | Admitting: Physician Assistant

## 2021-01-27 ENCOUNTER — Ambulatory Visit: Payer: Self-pay | Admitting: Physician Assistant

## 2021-01-27 NOTE — Progress Notes (Signed)
Received faxed refill request for patient's OC/Norethindrone 0.35 mg.  Per chart review, patient had Rx for same with refills for 1 year sent to pharmacy on 03/23/2020, when she had IP/RP visit at [facility].  Call to [facility]' pharmacy at [phone] and talked with Gerald Stabs who states that she will put back into their system that the patient has refills until 03/23/2021.

## 2021-02-08 ENCOUNTER — Telehealth: Payer: Self-pay | Admitting: Family Medicine

## 2021-02-08 DIAGNOSIS — E039 Hypothyroidism, unspecified: Secondary | ICD-10-CM

## 2021-02-08 MED ORDER — LEVOTHYROXINE SODIUM 150 MCG PO TABS: 150.0000 ug | ORAL_TABLET | Freq: Every day | ORAL | 1 refills | Status: DC

## 2021-02-08 NOTE — Telephone Encounter (Signed)
[facility] faxed refill request for the following medications:  levothyroxine (SYNTHROID) 150 MCG tablet  90 day supply  Last Rx: 11/04/20 Qty: 90 Refills: 0 LOV: 09/29/20 NOV: 03/30/21 with Dr. Jacinto Reap Please advise. Thanks TNP

## 2021-03-30 ENCOUNTER — Ambulatory Visit: Payer: 59 | Admitting: Family Medicine

## 2021-04-11 ENCOUNTER — Encounter: Payer: Self-pay | Admitting: Family Medicine

## 2021-04-11 ENCOUNTER — Other Ambulatory Visit: Payer: Self-pay

## 2021-04-11 ENCOUNTER — Ambulatory Visit (INDEPENDENT_AMBULATORY_CARE_PROVIDER_SITE_OTHER): Payer: 59 | Admitting: Family Medicine

## 2021-04-11 VITALS — BP 139/81 | HR 93 | Temp 98.1°F | Resp 16 | Ht 65.0 in | Wt 301.0 lb

## 2021-04-11 DIAGNOSIS — E039 Hypothyroidism, unspecified: Secondary | ICD-10-CM

## 2021-04-11 DIAGNOSIS — Z3041 Encounter for surveillance of contraceptive pills: Secondary | ICD-10-CM

## 2021-04-11 DIAGNOSIS — R7303 Prediabetes: Secondary | ICD-10-CM | POA: Diagnosis not present

## 2021-04-11 DIAGNOSIS — F321 Major depressive disorder, single episode, moderate: Secondary | ICD-10-CM | POA: Insufficient documentation

## 2021-04-11 DIAGNOSIS — E785 Hyperlipidemia, unspecified: Secondary | ICD-10-CM

## 2021-04-11 MED ORDER — NORETHINDRONE 0.35 MG PO TABS: 1.0000 | ORAL_TABLET | Freq: Every day | ORAL | 3 refills | Status: DC

## 2021-04-11 MED ORDER — SERTRALINE HCL 50 MG PO TABS: 50.0000 mg | ORAL_TABLET | Freq: Every day | ORAL | 1 refills | Status: DC

## 2021-04-11 NOTE — Assessment & Plan Note (Signed)
Previously well controlled Continue Synthroid at current dose  Recheck TSH and adjust Synthroid as indicated   

## 2021-04-11 NOTE — Progress Notes (Signed)
Established patient visit   Patient: Kristin Page   DOB: 08/07/1971   50 y.o. Female  MRN: [id number] Visit Date: 04/11/2021  Today's healthcare provider: Lavon Paganini, MD   Chief Complaint  Patient presents with   Hypothyroidism   Depression   Subjective    Depression        Associated symptoms include decreased concentration and fatigue.  Associated symptoms include no appetite change, no myalgias and no headaches.   Hypothyroid, follow-up  Lab Results  Component Value Date   TSH 4.330 09/29/2020   TSH 152.000 (H) 08/09/2020   Wt Readings from Last 3 Encounters:  04/11/21 (!) 301 lb (136.5 kg)  09/29/20 295 lb (133.8 kg)  08/09/20 (!) 323 lb 4.8 oz (146.6 kg)    She was last seen for hypothyroid 6 months ago.  Management since that visit includes no changes. She reports excellent compliance with treatment. She is not having side effects.   Symptoms: Yes change in energy level No constipation  No diarrhea No heat / cold intolerance  Yes nervousness No palpitations  No weight changes    -----------------------------------------------------------------------------------------  Depression, Follow-up  She  was last seen for this 6 months ago. Changes made at last visit include no changes. She is under continued stress due to the recent passing of her mother and dealing with familial matters. She is amenable to a medication.    She reports excellent compliance with treatment. She is not having side effects.   She reports excellent tolerance of treatment. Current symptoms include: depressed mood, difficulty concentrating, fatigue, and hypersomnia She feels she is Unchanged since last visit.  Depression screen [facility] 2/9 04/11/2021 09/29/2020 08/09/2020  Decreased Interest 2 1 1   Down, Depressed, Hopeless 1 1 1   PHQ - 2 Score 3 2 2   Altered sleeping 2 0 1  Tired, decreased energy 2 1 1   Change in appetite 1 1 1   Feeling bad or failure about yourself   1 0 1  Trouble concentrating 0 0 0  Moving slowly or fidgety/restless 0 0 0  Suicidal thoughts 0 0 0  PHQ-9 Score 9 4 6   Difficult doing work/chores Somewhat difficult Not difficult at all Very difficult    Birth Control She is requesting for a refill for 90 days because her insurance covers the cost.   -----------------------------------------------------------------------------------------  Patient Active Problem List   Diagnosis Date Noted   Abnormal cervical Papanicolaou smear 08/10/2020   Hypothyroidism 08/10/2020   Morbid obesity ([place]) 08/10/2020   Bilateral carpal tunnel syndrome 08/10/2020   Depression 08/10/2020   Suspected sleep apnea 08/10/2020   Hyperlipidemia 08/10/2020   Prediabetes 08/10/2020   Hidradenitis suppurativa 08/14/2016   Social History   Tobacco Use   Smoking status: Former    Packs/day: 0.50    Types: Cigarettes    Quit date: 12/24/2018    Years since quitting: 2.2   Smokeless tobacco: Never  Vaping Use   Vaping Use: Former   Devices: used to help her stop smoking  Substance Use Topics   Alcohol use: Yes    Comment: occasionally   Drug use: Never   No Known Allergies     Medications: Outpatient Medications Prior to Visit  Medication Sig   celecoxib (CELEBREX) 200 MG capsule Take 200 mg by mouth 2 (two) times daily.   cholecalciferol (VITAMIN D3) 25 MCG (1000 UNIT) tablet Take 5,000 Units by mouth daily.   cyclobenzaprine (FEXMID) 7.5 MG tablet Take 7.5 mg by  mouth 3 (three) times daily as needed for muscle spasms.   fexofenadine (ALLEGRA) 180 MG tablet Take 180 mg by mouth daily.   levothyroxine (SYNTHROID) 150 MCG tablet Take 1 tablet (150 mcg total) by mouth daily before breakfast.   Multiple Vitamins-Minerals (WOMENS MULTIVITAMIN PO) Take 1 tablet by mouth 1 day or 1 dose.   norethindrone (MICRONOR) 0.35 MG tablet Take 1 tablet (0.35 mg total) by mouth daily.   [DISCONTINUED] norethindrone (MICRONOR) 0.35 MG tablet TAKE 1 TABLET BY  MOUTH EVERY DAY   [DISCONTINUED] norethindrone (ORTHO MICRONOR) 0.35 MG tablet Take 1 tablet (0.35 mg total) by mouth daily.   [DISCONTINUED] predniSONE (DELTASONE) 20 MG tablet Take 6 pills on day 1, 5 pills on day 2 and so on until complete. (Patient not taking: Reported on 09/29/2020)   No facility-administered medications prior to visit.    Review of Systems  Constitutional:  Positive for activity change and fatigue. Negative for appetite change, chills, fever and unexpected weight change.  HENT:  Negative for ear pain, sinus pressure, sinus pain and sore throat.   Eyes:  Negative for pain and visual disturbance.  Respiratory:  Negative for cough, chest tightness, shortness of breath and wheezing.   Cardiovascular:  Negative for chest pain, palpitations and leg swelling.  Gastrointestinal:  Negative for abdominal pain, blood in stool, constipation, diarrhea, nausea and vomiting.  Endocrine: Negative for cold intolerance and heat intolerance.  Genitourinary:  Negative for flank pain, frequency, pelvic pain and urgency.  Musculoskeletal:  Negative for back pain, myalgias and neck pain.  Neurological:  Negative for dizziness, seizures, syncope, weakness, light-headedness, numbness and headaches.  Psychiatric/Behavioral:  Positive for decreased concentration, depression and sleep disturbance. The patient is nervous/anxious.        Objective    BP 139/81 (BP Location: Left Arm, Patient Position: Sitting, Cuff Size: Large)   Pulse 93   Temp 98.1 F (36.7 C) (Oral)   Resp 16   Ht 5' 5"  (1.651 m)   Wt (!) 301 lb (136.5 kg)   LMP 03/16/2021 (Exact Date)   BMI 50.09 kg/m  BP Readings from Last 3 Encounters:  04/11/21 139/81  09/29/20 132/85  08/09/20 121/85    Wt Readings from Last 3 Encounters:  04/11/21 (!) 301 lb (136.5 kg)  09/29/20 295 lb (133.8 kg)  08/09/20 (!) 323 lb 4.8 oz (146.6 kg)     Physical Exam Vitals reviewed.  Constitutional:      General: She is not in acute  distress.    Appearance: Normal appearance. She is well-developed. She is not diaphoretic.  HENT:     Head: Normocephalic and atraumatic.  Eyes:     General: No scleral icterus.    Conjunctiva/sclera: Conjunctivae normal.  Neck:     Thyroid: No thyromegaly.  Cardiovascular:     Rate and Rhythm: Normal rate and regular rhythm.     Pulses: Normal pulses.     Heart sounds: Normal heart sounds. No murmur heard. Pulmonary:     Effort: Pulmonary effort is normal. No respiratory distress.     Breath sounds: Normal breath sounds. No wheezing, rhonchi or rales.  Musculoskeletal:     Cervical back: Neck supple.     Right lower leg: No edema.     Left lower leg: No edema.  Lymphadenopathy:     Cervical: No cervical adenopathy.  Skin:    General: Skin is warm and dry.     Findings: No rash.  Neurological:  Mental Status: She is alert and oriented to person, place, and time. Mental status is at baseline.  Psychiatric:        Mood and Affect: Mood is depressed.        Behavior: Behavior normal.     No results found for any visits on 04/11/21.  Assessment & Plan     Problem List Items Addressed This Visit       Endocrine   Hypothyroidism - Primary    Previously well controlled Continue Synthroid at current dose  Recheck TSH and adjust Synthroid as indicated         Relevant Orders   TSH     Other   Morbid obesity ([place])    Discussed importance of healthy weight management Discussed diet and exercise        Hyperlipidemia    Reviewed last lipid panel Not currently on a statin Recheck FLP and CMP Discussed diet and exercise        Relevant Orders   Lipid panel   Comprehensive metabolic panel   Prediabetes    Recommend low carb diet Recheck A1c        Relevant Orders   Hemoglobin A1c   Current moderate episode of major depressive disorder without prior episode ([place])    Longstanding, never treated Will Start Zoloft 50 mg daily Discussed potential side  effects, incl GI upset, sexual dysfunction, increased anxiety, and SI Discussed that it can take 6-8 weeks to reach full efficacy Contracted for safety - no SI/HI Discussed synergistic effects of medications and therapy        Relevant Medications   sertraline (ZOLOFT) 50 MG tablet   Other Visit Diagnoses     Surveillance of previously prescribed contraceptive pill       Relevant Medications   norethindrone (MICRONOR) 0.35 MG tablet        Return in about 2 months (around 06/12/2021) for MDD/GAD f/u, virtual ok.      I,Essence Turner,acting as a Education administrator for Lavon Paganini, MD.,have documented all relevant documentation on the behalf of Lavon Paganini, MD,as directed by  Lavon Paganini, MD while in the presence of Lavon Paganini, MD.  I, Lavon Paganini, MD, have reviewed all documentation for this visit. The documentation on 04/11/21 for the exam, diagnosis, procedures, and orders are all accurate and complete.   Kaius Daino, Dionne Bucy, MD, MPH [facility]

## 2021-04-11 NOTE — Assessment & Plan Note (Signed)
Discussed importance of healthy weight management Discussed diet and exercise  

## 2021-04-11 NOTE — Assessment & Plan Note (Signed)
Longstanding, never treated Will Start Zoloft 50 mg daily Discussed potential side effects, incl GI upset, sexual dysfunction, increased anxiety, and SI Discussed that it can take 6-8 weeks to reach full efficacy Contracted for safety - no SI/HI Discussed synergistic effects of medications and therapy

## 2021-04-11 NOTE — Assessment & Plan Note (Signed)
Reviewed last lipid panel Not currently on a statin Recheck FLP and CMP Discussed diet and exercise

## 2021-04-11 NOTE — Assessment & Plan Note (Signed)
Recommend low carb diet Recheck A1c

## 2021-04-12 LAB — COMPREHENSIVE METABOLIC PANEL
ALT: 18 IU/L (ref 0–32)
AST: 21 IU/L (ref 0–40)
Albumin/Globulin Ratio: 1.3 (ref 1.2–2.2)
Albumin: 4.2 g/dL (ref 3.8–4.8)
Alkaline Phosphatase: 88 IU/L (ref 44–121)
BUN/Creatinine Ratio: 12 (ref 9–23)
BUN: 11 mg/dL (ref 6–24)
Bilirubin Total: 0.4 mg/dL (ref 0.0–1.2)
CO2: 24 mmol/L (ref 20–29)
Calcium: 10.1 mg/dL (ref 8.7–10.2)
Chloride: 102 mmol/L (ref 96–106)
Creatinine, Ser: 0.89 mg/dL (ref 0.57–1.00)
Globulin, Total: 3.2 g/dL (ref 1.5–4.5)
Glucose: 102 mg/dL — ABNORMAL HIGH (ref 65–99)
Potassium: 5 mmol/L (ref 3.5–5.2)
Sodium: 138 mmol/L (ref 134–144)
Total Protein: 7.4 g/dL (ref 6.0–8.5)
eGFR: 80 mL/min/{1.73_m2} (ref >59)

## 2021-04-12 LAB — TSH: TSH: 0.744 u[IU]/mL (ref 0.450–4.500)

## 2021-04-12 LAB — LIPID PANEL
Chol/HDL Ratio: 5 ratio — ABNORMAL HIGH (ref 0.0–4.4)
Cholesterol, Total: 208 mg/dL — ABNORMAL HIGH (ref 100–199)
HDL: 42 mg/dL (ref >39)
LDL Chol Calc (NIH): 144 mg/dL — ABNORMAL HIGH (ref 0–99)
Triglycerides: 120 mg/dL (ref 0–149)
VLDL Cholesterol Cal: 22 mg/dL (ref 5–40)

## 2021-04-12 LAB — HEMOGLOBIN A1C
Est. average glucose Bld gHb Est-mCnc: 120 mg/dL
Hgb A1c MFr Bld: 5.8 % — ABNORMAL HIGH (ref 4.8–5.6)

## 2021-05-08 ENCOUNTER — Other Ambulatory Visit: Payer: Self-pay | Admitting: Family Medicine

## 2021-05-08 DIAGNOSIS — E039 Hypothyroidism, unspecified: Secondary | ICD-10-CM

## 2021-05-08 NOTE — Telephone Encounter (Signed)
   Notes to clinic:  Patient has appt on 06/26/2021 Just filled for 90 day  Patient should have enough until appt Review for future refill    Requested Prescriptions  Pending Prescriptions Disp Refills   levothyroxine (SYNTHROID) 150 MCG tablet [Pharmacy Med Name: LEVOTHYROXINE 0.150MG (150MCG) TAB] 90 tablet 1    Sig: TAKE 1 TABLET(150 MCG) BY MOUTH DAILY BEFORE AND BREAKFAST     Endocrinology:  Hypothyroid Agents Failed - 05/08/2021 11:22 AM      Failed - TSH needs to be rechecked within 3 months after an abnormal result. Refill until TSH is due.      Passed - TSH in normal range and within 360 days    TSH  Date Value Ref Range Status  04/11/2021 0.744 0.450 - 4.500 uIU/mL Final          Passed - Valid encounter within last 12 months    Recent Outpatient Visits           3 weeks ago Hypothyroidism, unspecified type   [facility] [place], Dionne Bucy, MD   7 months ago Annual physical exam   [place], [place], [place]   9 months ago Hypothyroidism, unspecified type   [place], Wendee Beavers, PA-C       Future Appointments             In 1 month Bacigalupo, Dionne Bucy, MD [facility], [place]

## 2021-06-26 ENCOUNTER — Ambulatory Visit: Payer: Self-pay | Admitting: Family Medicine

## 2021-07-22 ENCOUNTER — Other Ambulatory Visit: Payer: Self-pay | Admitting: Family Medicine

## 2021-07-22 NOTE — Telephone Encounter (Signed)
last RF 04/11/21 #90 1 RF

## 2021-08-31 ENCOUNTER — Other Ambulatory Visit: Payer: Self-pay | Admitting: Family Medicine

## 2021-08-31 DIAGNOSIS — E039 Hypothyroidism, unspecified: Secondary | ICD-10-CM

## 2021-10-20 ENCOUNTER — Other Ambulatory Visit: Payer: Self-pay | Admitting: Family Medicine

## 2021-10-20 NOTE — Telephone Encounter (Signed)
LOV:04/11/21 NOV: 11/20/2021 LR: 04/11/21  patient is requesting advance approval of refills for this medication to [facility].

## 2021-11-20 ENCOUNTER — Encounter: Payer: Self-pay | Admitting: Family Medicine

## 2021-11-20 ENCOUNTER — Other Ambulatory Visit: Payer: Self-pay

## 2021-11-20 ENCOUNTER — Ambulatory Visit: Payer: 59 | Admitting: Family Medicine

## 2021-11-20 VITALS — BP 128/84 | HR 93 | Temp 97.6°F | Resp 16 | Ht 65.0 in | Wt 308.4 lb

## 2021-11-20 DIAGNOSIS — F321 Major depressive disorder, single episode, moderate: Secondary | ICD-10-CM | POA: Diagnosis not present

## 2021-11-20 DIAGNOSIS — E039 Hypothyroidism, unspecified: Secondary | ICD-10-CM | POA: Diagnosis not present

## 2021-11-20 DIAGNOSIS — R7303 Prediabetes: Secondary | ICD-10-CM

## 2021-11-20 MED ORDER — SERTRALINE HCL 100 MG PO TABS: 100.0000 mg | ORAL_TABLET | Freq: Every day | ORAL | 1 refills | Status: DC

## 2021-11-20 NOTE — Assessment & Plan Note (Signed)
Fairly well controlled Not quite to goal Increase zoloft to 178m daily Consider therapy Recheck in 2 months and consider dose titration

## 2021-11-20 NOTE — Progress Notes (Signed)
I,Sulibeya S Dimas,acting as a Education administrator for Lavon Paganini, MD.,have documented all relevant documentation on the behalf of Lavon Paganini, MD,as directed by  Lavon Paganini, MD while in the presence of Lavon Paganini, MD.   Established patient visit   Patient: Kristin Page   DOB: 05-31-1971   51 y.o. Female  MRN: [id number] Visit Date: 11/20/2021  Today's healthcare provider: Lavon Paganini, MD   Chief Complaint  Patient presents with   Hypothyroidism   Depression   Subjective    HPI  Depression, Follow-up  She  was last seen for this 9 months ago. Changes made at last visit include start Zoloft 50 mg daily, f/u 6-8 weeks.   She reports fair compliance with treatment. Patient went without mediations for a few months. She is not having side effects.   She reports excellent tolerance of treatment. Current symptoms include: depressed mood, fatigue, and insomnia She feels she is Unchanged since last visit.  Depression screen [facility] 2/9 11/20/2021 04/11/2021 09/29/2020  Decreased Interest 2 2 1   Down, Depressed, Hopeless 1 1 1   PHQ - 2 Score 3 3 2   Altered sleeping 2 2 0  Tired, decreased energy 2 2 1   Change in appetite 2 1 1   Feeling bad or failure about yourself  0 1 0  Trouble concentrating 1 0 0  Moving slowly or fidgety/restless 0 0 0  Suicidal thoughts 0 0 0  PHQ-9 Score 10 9 4   Difficult doing work/chores Somewhat difficult Somewhat difficult Not difficult at all    ----------------------------------------------------------------------------------------- Hypothyroid, follow-up  Lab Results  Component Value Date   TSH 0.744 04/11/2021   TSH 4.330 09/29/2020   TSH 152.000 (H) 08/09/2020    Wt Readings from Last 3 Encounters:  11/20/21 (!) 308 lb 6.4 oz (139.9 kg)  04/11/21 (!) 301 lb (136.5 kg)  09/29/20 295 lb (133.8 kg)    She was last seen for hypothyroid 9 months ago.  Management since that visit includes no changes. She reports  excellent compliance with treatment. She is not having side effects.   Symptoms: Yes change in energy level No constipation  No diarrhea No heat / cold intolerance  No nervousness No palpitations  Yes weight changes    ----------------------------------------------------------------------------------------- Weight is increasing again. Doesn't know what to attribute this to. Reports that she doesn't eat enough - sometimes only 1 meal per day. Goal of 5k steps per day.  Medications: Outpatient Medications Prior to Visit  Medication Sig   cholecalciferol (VITAMIN D3) 25 MCG (1000 UNIT) tablet Take 5,000 Units by mouth daily.   fexofenadine (ALLEGRA) 180 MG tablet Take 180 mg by mouth daily.   levothyroxine (SYNTHROID) 150 MCG tablet TAKE 1 TABLET(150 MCG) BY MOUTH DAILY BEFORE AND BREAKFAST   Multiple Vitamins-Minerals (WOMENS MULTIVITAMIN PO) Take 1 tablet by mouth 1 day or 1 dose.   norethindrone (MICRONOR) 0.35 MG tablet Take 1 tablet (0.35 mg total) by mouth daily.   [DISCONTINUED] sertraline (ZOLOFT) 50 MG tablet TAKE 1 TABLET(50 MG) BY MOUTH DAILY   [DISCONTINUED] celecoxib (CELEBREX) 200 MG capsule Take 200 mg by mouth 2 (two) times daily.   [DISCONTINUED] cyclobenzaprine (FEXMID) 7.5 MG tablet Take 7.5 mg by mouth 3 (three) times daily as needed for muscle spasms.   No facility-administered medications prior to visit.    Review of Systems  Constitutional:  Positive for appetite change and unexpected weight change.  Respiratory:  Negative for chest tightness and shortness of breath.   Cardiovascular:  Negative for chest pain and palpitations.  Psychiatric/Behavioral:  Positive for dysphoric mood and sleep disturbance. The patient is nervous/anxious.        Objective    BP 128/84 (BP Location: Left Arm, Cuff Size: Large)    Pulse 93    Temp 97.6 F (36.4 C) (Temporal)    Resp 16    Ht 5' 5"  (1.651 m)    Wt (!) 308 lb 6.4 oz (139.9 kg)    BMI 51.32 kg/m  BP Readings from Last  3 Encounters:  11/20/21 128/84  04/11/21 139/81  09/29/20 132/85   Wt Readings from Last 3 Encounters:  11/20/21 (!) 308 lb 6.4 oz (139.9 kg)  04/11/21 (!) 301 lb (136.5 kg)  09/29/20 295 lb (133.8 kg)      Physical Exam Vitals reviewed.  Constitutional:      General: She is not in acute distress.    Appearance: Normal appearance. She is well-developed. She is not diaphoretic.  HENT:     Head: Normocephalic and atraumatic.  Eyes:     General: No scleral icterus.    Conjunctiva/sclera: Conjunctivae normal.  Neck:     Thyroid: No thyromegaly.  Cardiovascular:     Rate and Rhythm: Normal rate and regular rhythm.     Pulses: Normal pulses.     Heart sounds: Normal heart sounds. No murmur heard. Pulmonary:     Effort: Pulmonary effort is normal. No respiratory distress.     Breath sounds: Normal breath sounds. No wheezing, rhonchi or rales.  Musculoskeletal:     Cervical back: Neck supple.     Right lower leg: No edema.     Left lower leg: No edema.  Lymphadenopathy:     Cervical: No cervical adenopathy.  Skin:    General: Skin is warm and dry.     Findings: No rash.  Neurological:     Mental Status: She is alert and oriented to person, place, and time. Mental status is at baseline.  Psychiatric:        Mood and Affect: Mood normal.        Behavior: Behavior normal.      No results found for any visits on 11/20/21.  Assessment & Plan     Problem List Items Addressed This Visit       Endocrine   Hypothyroidism    Previously well controlled Continue Synthroid at current dose  Recheck TSH and adjust Synthroid as indicated        Relevant Orders   TSH     Other   Morbid obesity ([place])    Discussed importance of healthy weight management Discussed diet and exercise  Discussed intermittent fasting      Relevant Orders   Basic Metabolic Panel (BMET)   Prediabetes - Primary    Recommend low carb diet Recheck A1c       Relevant Orders   Hemoglobin I7T    Basic Metabolic Panel (BMET)   Current moderate episode of major depressive disorder without prior episode ([place])    Fairly well controlled Not quite to goal Increase zoloft to 189m daily Consider therapy Recheck in 2 months and consider dose titration      Relevant Medications   sertraline (ZOLOFT) 100 MG tablet     Return in about 2 months (around 01/18/2022) for MDD, BP f/u.      I, ALavon Paganini MD, have reviewed all documentation for this visit. The documentation on 11/20/21 for the exam, diagnosis, procedures, and orders  are all accurate and complete.   Jameon Deller, Dionne Bucy, MD, MPH [facility]

## 2021-11-20 NOTE — Assessment & Plan Note (Signed)
Discussed importance of healthy weight management Discussed diet and exercise  Discussed intermittent fasting

## 2021-11-20 NOTE — Assessment & Plan Note (Signed)
Recommend low carb diet Recheck A1c

## 2021-11-20 NOTE — Assessment & Plan Note (Signed)
Previously well controlled Continue Synthroid at current dose  Recheck TSH and adjust Synthroid as indicated   

## 2021-11-25 LAB — BASIC METABOLIC PANEL
BUN/Creatinine Ratio: 14 (ref 9–23)
BUN: 13 mg/dL (ref 6–24)
CO2: 25 mmol/L (ref 20–29)
Calcium: 9.7 mg/dL (ref 8.7–10.2)
Chloride: 102 mmol/L (ref 96–106)
Creatinine, Ser: 0.94 mg/dL (ref 0.57–1.00)
Glucose: 89 mg/dL (ref 70–99)
Potassium: 5.3 mmol/L — ABNORMAL HIGH (ref 3.5–5.2)
Sodium: 141 mmol/L (ref 134–144)
eGFR: 74 mL/min/{1.73_m2} (ref >59)

## 2021-11-25 LAB — TSH: TSH: 0.424 u[IU]/mL — ABNORMAL LOW (ref 0.450–4.500)

## 2021-11-25 LAB — HEMOGLOBIN A1C
Est. average glucose Bld gHb Est-mCnc: 120 mg/dL
Hgb A1c MFr Bld: 5.8 % — ABNORMAL HIGH (ref 4.8–5.6)

## 2021-12-05 ENCOUNTER — Other Ambulatory Visit: Payer: Self-pay | Admitting: Family Medicine

## 2021-12-05 DIAGNOSIS — E039 Hypothyroidism, unspecified: Secondary | ICD-10-CM

## 2021-12-05 NOTE — Telephone Encounter (Signed)
Requested Prescriptions  ?Pending Prescriptions Disp Refills  ?? levothyroxine (SYNTHROID) 150 MCG tablet [Pharmacy Med Name: LEVOTHYROXINE 0.150MG (150MCG) TAB] 90 tablet 1  ?  Sig: TAKE 1 TABLET(150 MCG) BY MOUTH DAILY BEFORE BREAKFAST  ?  ? Endocrinology:  Hypothyroid Agents Failed - 12/05/2021  8:00 AM  ?  ?  Failed - TSH in normal range and within 360 days  ?  TSH  ?Date Value Ref Range Status  ?11/20/2021 0.424 (L) 0.450 - 4.500 uIU/mL Final  ?   ?  ?  Passed - Valid encounter within last 12 months  ?  Recent Outpatient Visits   ?      ? 2 weeks ago Prediabetes  ? [facility] [place], Dionne Bucy, MD  ? 7 months ago Hypothyroidism, unspecified type  ? [facility] Bacig[facility], Dionne Bucy, MD  ? 1 year ago Annual physical exam  ? [facility] Car[place] Collet M, [place]  ? 1 year ago Hypothyroidism, unspecified type  ? [facility] [place], [place], [place]  ?  ?  ?Future Appointments   ?        ? In 1 month Bacigalupo, Dionne Bucy, MD [facility]  ?  ? ?  ?  ?  ? ?

## 2022-01-13 ENCOUNTER — Other Ambulatory Visit: Payer: Self-pay | Admitting: Family Medicine

## 2022-01-15 NOTE — Telephone Encounter (Signed)
Dose changed 11/20/21. ?Requested Prescriptions  ?Refused Prescriptions Disp Refills  ?? sertraline (ZOLOFT) 50 MG tablet [Pharmacy Med Name: SERTRALINE 50MG TABLETS] 90 tablet 0  ?  Sig: TAKE 1 TABLET(50 MG) BY MOUTH DAILY  ?  ? Not Delegated - Psychiatry:  Antidepressants - SSRI - sertraline Failed - 01/13/2022 11:22 AM  ?  ?  Failed - This refill cannot be delegated  ?  ?  Passed - AST in normal range and within 360 days  ?  AST  ?Date Value Ref Range Status  ?04/11/2021 21 0 - 40 IU/L Final  ?   ?  ?  Passed - ALT in normal range and within 360 days  ?  ALT  ?Date Value Ref Range Status  ?04/11/2021 18 0 - 32 IU/L Final  ?   ?  ?  Passed - Completed PHQ-2 or PHQ-9 in the last 360 days  ?  ?  Passed - Valid encounter within last 6 months  ?  Recent Outpatient Visits   ?      ? 1 month ago Prediabetes  ? [facility] [place], Dionne Bucy, MD  ? 9 months ago Hypothyroidism, unspecified type  ? [facility] Bacigalupo, Dionne Bucy, MD  ? 1 year ago Annual physical exam  ? [facility] Car[place] Collet M, [place]  ? 1 year ago Hypothyroidism, unspecified type  ? [facility] [place], [place], [place]  ?  ?  ?Future Appointments   ?        ? In 1 week Bacigalupo, Dionne Bucy, MD [facility]  ?  ? ?  ?  ?  ? ?

## 2022-01-22 ENCOUNTER — Encounter: Payer: Self-pay | Admitting: Family Medicine

## 2022-01-22 ENCOUNTER — Ambulatory Visit (INDEPENDENT_AMBULATORY_CARE_PROVIDER_SITE_OTHER): Payer: 59 | Admitting: Family Medicine

## 2022-01-22 ENCOUNTER — Telehealth: Payer: 59 | Admitting: Family Medicine

## 2022-01-22 VITALS — BP 136/85 | HR 100 | Temp 98.7°F | Resp 16 | Wt 297.0 lb

## 2022-01-22 DIAGNOSIS — E039 Hypothyroidism, unspecified: Secondary | ICD-10-CM

## 2022-01-22 DIAGNOSIS — F321 Major depressive disorder, single episode, moderate: Secondary | ICD-10-CM

## 2022-01-22 DIAGNOSIS — Z3041 Encounter for surveillance of contraceptive pills: Secondary | ICD-10-CM

## 2022-01-22 MED ORDER — SERTRALINE HCL 100 MG PO TABS
100.0000 mg | ORAL_TABLET | Freq: Every day | ORAL | 1 refills | Status: DC
Start: 2022-01-22 — End: 2022-07-30

## 2022-01-22 MED ORDER — LEVOTHYROXINE SODIUM 150 MCG PO TABS: 150.0000 ug | ORAL_TABLET | Freq: Every day | ORAL | 1 refills | Status: DC

## 2022-01-22 MED ORDER — NORETHINDRONE 0.35 MG PO TABS: 1.0000 | ORAL_TABLET | Freq: Every day | ORAL | 3 refills | Status: DC

## 2022-01-22 NOTE — Progress Notes (Deleted)
      Established patient visit   Patient: Kristin Page   DOB: [birth year]   51 y.o. Female  MRN: [id number] Visit Date: 01/22/2022  Today's healthcare provider: Lavon Paganini, MD   No chief complaint on file.  Subjective    HPI  Depression, Follow-up  She  was last seen for this 2 months ago. Changes made at last visit include increase Zoloft to 100 mg daily. Consider therapy and recheck in 2 months and consider dose titration.   She reports {excellent/good/fair/poor:19665} compliance with treatment. She {is/is not:21021397} having side effects. ***  She reports {DESC; GOOD/FAIR/POOR:18685} tolerance of treatment. Current symptoms include: {Symptoms; depression:1002} She feels she is {improved/worse/unchanged:3041574} since last visit.     11/20/2021    1:07 PM 04/11/2021    9:58 AM 09/29/2020    3:18 PM  Depression screen PHQ 2/9  Decreased Interest 2 2 1   Down, Depressed, Hopeless 1 1 1   PHQ - 2 Score 3 3 2   Altered sleeping 2 2 0  Tired, decreased energy 2 2 1   Change in appetite 2 1 1   Feeling bad or failure about yourself  0 1 0  Trouble concentrating 1 0 0  Moving slowly or fidgety/restless 0 0 0  Suicidal thoughts 0 0 0  PHQ-9 Score 10 9 4   Difficult doing work/chores Somewhat difficult Somewhat difficult Not difficult at all    -----------------------------------------------------------------------------------------   Medications: Outpatient Medications Prior to Visit  Medication Sig   cholecalciferol (VITAMIN D3) 25 MCG (1000 UNIT) tablet Take 5,000 Units by mouth daily.   fexofenadine (ALLEGRA) 180 MG tablet Take 180 mg by mouth daily.   levothyroxine (SYNTHROID) 150 MCG tablet TAKE 1 TABLET(150 MCG) BY MOUTH DAILY BEFORE BREAKFAST   Multiple Vitamins-Minerals (WOMENS MULTIVITAMIN PO) Take 1 tablet by mouth 1 day or 1 dose.   norethindrone (MICRONOR) 0.35 MG tablet Take 1 tablet (0.35 mg total) by mouth daily.   sertraline (ZOLOFT) 100 MG tablet  Take 1 tablet (100 mg total) by mouth daily.   No facility-administered medications prior to visit.    Review of Systems  {Labs  Heme  Chem  Endocrine  Serology  Results Review (optional):23779}   Objective    There were no vitals taken for this visit. {Show previous vital signs (optional):23777}  Physical Exam  ***  No results found for any visits on 01/22/22.  Assessment & Plan     ***  No follow-ups on file.      {provider attestation***:1}   Lavon Paganini, MD  [facility] [phone] (phone) [phone] (fax)  [place]

## 2022-01-22 NOTE — Assessment & Plan Note (Signed)
Chronic and well controlled ?Improved since increasing zoloft dose - continue at current dose of 159m daily ?

## 2022-01-22 NOTE — Progress Notes (Signed)
?  ? ?I,Joseline E Rosas,acting as a scribe for Lavon Paganini, MD.,have documented all relevant documentation on the behalf of Lavon Paganini, MD,as directed by  Lavon Paganini, MD while in the presence of Lavon Paganini, MD.  ? ?Established patient visit ? ? ?Patient: Kristin Page   DOB: Nov 26, 1970   51 y.o. Female  MRN: [id number] ?Visit Date: 01/22/2022 ? ?Today's healthcare provider: Lavon Paganini, MD  ? ?Chief Complaint  ?Patient presents with  ? Follow-up Depression  ? ?Subjective  ?  ?HPI  ?Depression, Follow-up ? ?She  was last seen for this 2 months ago. ?Changes made at last visit include increase Zoloft to 100 mg daily. Consider Therapy and recheck in 2 months and consider titration ?  ?She reports excellent compliance with treatment. ?She is not having side effects.  ? ?She reports excellent tolerance of treatment. ?Current symptoms include:  none ?She feels she is Improved since last visit. ? ?Able to get back to the gym. ? ? ?  01/22/2022  ?  3:03 PM 11/20/2021  ?  1:07 PM 04/11/2021  ?  9:58 AM  ?Depression screen PHQ 2/9  ?Decreased Interest 0 2 2  ?Down, Depressed, Hopeless 0 1 1  ?PHQ - 2 Score 0 3 3  ?Altered sleeping 0 2 2  ?Tired, decreased energy 0 2 2  ?Change in appetite 0 2 1  ?Feeling bad or failure about yourself  0 0 1  ?Trouble concentrating 0 1 0  ?Moving slowly or fidgety/restless 0 0 0  ?Suicidal thoughts 0 0 0  ?PHQ-9 Score 0 10 9  ?Difficult doing work/chores Not difficult at all Somewhat difficult Somewhat difficult  ?  ?-----------------------------------------------------------------------------------------  ? ?  01/22/2022  ?  3:04 PM  ?GAD 7 : Generalized Anxiety Score  ?Nervous, Anxious, on Edge 1  ?Control/stop worrying 0  ?Worry too much - different things 0  ?Trouble relaxing 0  ?Restless 0  ?Easily annoyed or irritable 0  ?Afraid - awful might happen 0  ?Total GAD 7 Score 1  ?Anxiety Difficulty Not difficult at all  ? ?  ?Medications: ?Outpatient Medications  Prior to Visit  ?Medication Sig  ? cholecalciferol (VITAMIN D3) 25 MCG (1000 UNIT) tablet Take 5,000 Units by mouth daily.  ? fexofenadine (ALLEGRA) 180 MG tablet Take 180 mg by mouth daily.  ? Multiple Vitamins-Minerals (WOMENS MULTIVITAMIN PO) Take 1 tablet by mouth 1 day or 1 dose.  ? [DISCONTINUED] levothyroxine (SYNTHROID) 150 MCG tablet TAKE 1 TABLET(150 MCG) BY MOUTH DAILY BEFORE BREAKFAST  ? [DISCONTINUED] norethindrone (MICRONOR) 0.35 MG tablet Take 1 tablet (0.35 mg total) by mouth daily.  ? [DISCONTINUED] sertraline (ZOLOFT) 100 MG tablet Take 1 tablet (100 mg total) by mouth daily.  ? ?No facility-administered medications prior to visit.  ? ? ?Review of Systems per HPI ? ? ?  Objective  ?  ?BP 136/85 (BP Location: Left Arm, Patient Position: Sitting, Cuff Size: Large)   Pulse 100   Temp 98.7 ?F (37.1 ?C) (Oral)   Resp 16   Wt 297 lb (134.7 kg)   BMI 49.42 kg/m?  ? ? ?Physical Exam ?Vitals reviewed.  ?Constitutional:   ?   General: She is not in acute distress. ?   Appearance: Normal appearance. She is well-developed. She is not diaphoretic.  ?HENT:  ?   Head: Normocephalic and atraumatic.  ?Eyes:  ?   General: No scleral icterus. ?   Conjunctiva/sclera: Conjunctivae normal.  ?Neck:  ?   Thyroid: No  thyromegaly.  ?Cardiovascular:  ?   Rate and Rhythm: Normal rate and regular rhythm.  ?   Pulses: Normal pulses.  ?   Heart sounds: Normal heart sounds. No murmur heard. ?Pulmonary:  ?   Effort: Pulmonary effort is normal. No respiratory distress.  ?   Breath sounds: Normal breath sounds. No wheezing, rhonchi or rales.  ?Musculoskeletal:  ?   Cervical back: Neck supple.  ?   Right lower leg: No edema.  ?   Left lower leg: No edema.  ?Lymphadenopathy:  ?   Cervical: No cervical adenopathy.  ?Skin: ?   General: Skin is warm and dry.  ?   Findings: No rash.  ?Neurological:  ?   Mental Status: She is alert and oriented to person, place, and time. Mental status is at baseline.  ?Psychiatric:     ?   Mood and  Affect: Mood normal.     ?   Behavior: Behavior normal.  ?  ? ? ?No results found for any visits on 01/22/22. ? Assessment & Plan  ?  ? ?Problem List Items Addressed This Visit   ? ?  ? Endocrine  ? Hypothyroidism  ?  TSH slightly low on last check, but asymptomatic ?Still on same synthroid dose ?Will recheck today and adjust dose as indicated ? ?  ?  ? Relevant Medications  ? levothyroxine (SYNTHROID) 150 MCG tablet  ? Other Relevant Orders  ? TSH  ?  ? Other  ? Morbid obesity ([place])  ?  Congratulated on weight loss ?Discussed importance of healthy weight management ?Discussed diet and exercise  ?  ?  ? Current moderate episode of major depressive disorder without prior episode ([place]) - Primary  ?  Chronic and well controlled ?Improved since increasing zoloft dose - continue at current dose of 145m daily ? ?  ?  ? Relevant Medications  ? sertraline (ZOLOFT) 100 MG tablet  ? ?Other Visit Diagnoses   ? ? Surveillance of previously prescribed contraceptive pill      ? Relevant Medications  ? norethindrone (MICRONOR) 0.35 MG tablet  ? ?  ?  ? ?Return in about 6 months (around 07/25/2022) for CPE.  ?   ? ?I, ALavon Paganini MD, have reviewed all documentation for this visit. The documentation on 01/22/22 for the exam, diagnosis, procedures, and orders are all accurate and complete. ? ? ?BVirginia Crews MD, MPH ?[facility]?[facility]   ?

## 2022-01-22 NOTE — Assessment & Plan Note (Signed)
Congratulated on weight loss ?Discussed importance of healthy weight management ?Discussed diet and exercise  ?

## 2022-01-22 NOTE — Assessment & Plan Note (Signed)
TSH slightly low on last check, but asymptomatic ?Still on same synthroid dose ?Will recheck today and adjust dose as indicated ?

## 2022-01-23 LAB — TSH: TSH: 1.9 u[IU]/mL (ref 0.450–4.500)
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
## 2022-07-30 ENCOUNTER — Ambulatory Visit (INDEPENDENT_AMBULATORY_CARE_PROVIDER_SITE_OTHER): Payer: 59 | Admitting: Family Medicine

## 2022-07-30 ENCOUNTER — Encounter: Payer: Self-pay | Admitting: Family Medicine

## 2022-07-30 VITALS — BP 124/87 | HR 96 | Temp 98.7°F | Resp 16 | Ht 65.0 in | Wt 313.2 lb

## 2022-07-30 DIAGNOSIS — E039 Hypothyroidism, unspecified: Secondary | ICD-10-CM | POA: Diagnosis not present

## 2022-07-30 DIAGNOSIS — R7303 Prediabetes: Secondary | ICD-10-CM

## 2022-07-30 DIAGNOSIS — M5442 Lumbago with sciatica, left side: Secondary | ICD-10-CM

## 2022-07-30 DIAGNOSIS — Z23 Encounter for immunization: Secondary | ICD-10-CM | POA: Diagnosis not present

## 2022-07-30 DIAGNOSIS — G8929 Other chronic pain: Secondary | ICD-10-CM

## 2022-07-30 DIAGNOSIS — M5441 Lumbago with sciatica, right side: Secondary | ICD-10-CM

## 2022-07-30 DIAGNOSIS — F32A Depression, unspecified: Secondary | ICD-10-CM | POA: Diagnosis not present

## 2022-07-30 DIAGNOSIS — E785 Hyperlipidemia, unspecified: Secondary | ICD-10-CM | POA: Diagnosis not present

## 2022-07-30 DIAGNOSIS — Z3041 Encounter for surveillance of contraceptive pills: Secondary | ICD-10-CM

## 2022-07-30 DIAGNOSIS — F321 Major depressive disorder, single episode, moderate: Secondary | ICD-10-CM

## 2022-07-30 DIAGNOSIS — Z1231 Encounter for screening mammogram for malignant neoplasm of breast: Secondary | ICD-10-CM

## 2022-07-30 MED ORDER — GABAPENTIN 300 MG PO CAPS: 300.0000 mg | ORAL_CAPSULE | Freq: Every day | ORAL | 3 refills | Status: DC

## 2022-07-30 MED ORDER — NORETHINDRONE 0.35 MG PO TABS: 1.0000 | ORAL_TABLET | Freq: Every day | ORAL | 3 refills | Status: DC

## 2022-07-30 MED ORDER — LEVOTHYROXINE SODIUM 150 MCG PO TABS: 150.0000 ug | ORAL_TABLET | Freq: Every day | ORAL | 1 refills | Status: DC

## 2022-07-30 MED ORDER — SERTRALINE HCL 100 MG PO TABS: 100.0000 mg | ORAL_TABLET | Freq: Every day | ORAL | 1 refills | Status: DC

## 2022-07-30 MED ORDER — CYCLOBENZAPRINE HCL 7.5 MG PO TABS: 7.5000 mg | ORAL_TABLET | Freq: Three times a day (TID) | ORAL | 2 refills | Status: DC | PRN

## 2022-07-30 NOTE — Assessment & Plan Note (Signed)
Recommend low carb diet °Recheck A1c  °

## 2022-07-30 NOTE — Assessment & Plan Note (Signed)
Previously well controlled Continue Synthroid at current dose  Recheck TSH and adjust Synthroid as indicated   

## 2022-07-30 NOTE — Assessment & Plan Note (Signed)
Well-controlled Continue Zoloft 100 mg daily 

## 2022-07-30 NOTE — Assessment & Plan Note (Signed)
Reviewed last lipid panel Not currently on a statin Recheck FLP and CMP Discussed diet and exercise  

## 2022-07-30 NOTE — Assessment & Plan Note (Signed)
Discussed importance of healthy weight management Discussed diet and exercise  

## 2022-07-30 NOTE — Patient Instructions (Signed)
Please call [facility] to schedule your annual routine mammogram [phone]  

## 2022-07-30 NOTE — Assessment & Plan Note (Signed)
Chronic and stable Not very well controlled Continue Flexeril as needed Trial of gabapentin 300 mg nightly Can titrate dose up to 3 times daily if needed and well-tolerated

## 2022-10-18 ENCOUNTER — Other Ambulatory Visit: Payer: Self-pay

## 2022-10-18 MED ORDER — GABAPENTIN 300 MG PO CAPS: 300.0000 mg | ORAL_CAPSULE | Freq: Every day | ORAL | 0 refills | Status: DC

## 2022-12-03 ENCOUNTER — Other Ambulatory Visit: Payer: Self-pay | Admitting: Family Medicine

## 2022-12-03 MED ORDER — GABAPENTIN 300 MG PO CAPS: 300.0000 mg | ORAL_CAPSULE | Freq: Three times a day (TID) | ORAL | 3 refills | Status: DC

## 2022-12-12 ENCOUNTER — Other Ambulatory Visit: Payer: Self-pay

## 2022-12-12 DIAGNOSIS — E039 Hypothyroidism, unspecified: Secondary | ICD-10-CM

## 2022-12-12 MED ORDER — LEVOTHYROXINE SODIUM 150 MCG PO TABS: 150.0000 ug | ORAL_TABLET | Freq: Every day | ORAL | 1 refills | Status: DC

## 2023-01-31 ENCOUNTER — Ambulatory Visit (INDEPENDENT_AMBULATORY_CARE_PROVIDER_SITE_OTHER): Payer: 59 | Admitting: Family Medicine

## 2023-01-31 ENCOUNTER — Encounter: Payer: Self-pay | Admitting: Family Medicine

## 2023-01-31 VITALS — BP 120/82 | HR 71 | Temp 97.8°F | Resp 12 | Ht 65.0 in | Wt 309.3 lb

## 2023-01-31 DIAGNOSIS — E785 Hyperlipidemia, unspecified: Secondary | ICD-10-CM

## 2023-01-31 DIAGNOSIS — E039 Hypothyroidism, unspecified: Secondary | ICD-10-CM | POA: Diagnosis not present

## 2023-01-31 DIAGNOSIS — F321 Major depressive disorder, single episode, moderate: Secondary | ICD-10-CM

## 2023-01-31 DIAGNOSIS — Z0000 Encounter for general adult medical examination without abnormal findings: Secondary | ICD-10-CM

## 2023-01-31 DIAGNOSIS — R7303 Prediabetes: Secondary | ICD-10-CM

## 2023-01-31 MED ORDER — GABAPENTIN 300 MG PO CAPS: 300.0000 mg | ORAL_CAPSULE | Freq: Three times a day (TID) | ORAL | 3 refills | Status: DC

## 2023-01-31 NOTE — Assessment & Plan Note (Signed)
Previously well controlled Continue Synthroid at current dose  Recheck TSH and adjust Synthroid as indicated   

## 2023-01-31 NOTE — Progress Notes (Signed)
I,Sulibeya S Dimas,acting as a Neurosurgeon for Shirlee Latch, MD.,have documented all relevant documentation on the behalf of Shirlee Latch, MD,as directed by  Shirlee Latch, MD while in the presence of Shirlee Latch, MD.    Complete physical exam   Patient: Kristin Page   DOB: May 11, 1971   52 y.o. Female  MRN: [id number] Visit Date: 01/31/2023  Today's healthcare provider: Shirlee Latch, MD   No chief complaint on file.  Subjective    Sanna Gunby is a 52 y.o. female who presents today for a complete physical exam.  She reports consuming a general diet. The patient does not participate in regular exercise at present. She generally feels well. She reports sleeping well. She does not have additional problems to discuss today.  HPI   Will call to schedule mammo  Past Medical History:  Diagnosis Date   History of abnormal cervical Pap smear 08/26/2016   Thyroid disease    Past Surgical History:  Procedure Laterality Date   CESAREAN SECTION     Social History   Socioeconomic History   Marital status: Divorced    Spouse name: Not on file   Number of children: Not on file   Years of education: Not on file   Highest education level: GED or equivalent  Occupational History   Not on file  Tobacco Use   Smoking status: Former    Packs/day: .5    Types: Cigarettes    Quit date: 12/24/2018    Years since quitting: 4.1   Smokeless tobacco: Never  Vaping Use   Vaping Use: Some days   Devices: used to help her stop smoking  Substance and Sexual Activity   Alcohol use: Yes    Comment: occasionally   Drug use: Never   Sexual activity: Yes    Birth control/protection: Pill  Other Topics Concern   Not on file  Social History Narrative   Not on file   Social Determinants of Health   Financial Resource Strain: Patient Declined (01/21/2022)   Overall Financial Resource Strain (CARDIA)    Difficulty of Paying Living Expenses: Patient declined  Food  Insecurity: Patient Declined (01/21/2022)   Hunger Vital Sign    Worried About Running Out of Food in the Last Year: Patient declined    Ran Out of Food in the Last Year: Patient declined  Transportation Needs: No Transportation Needs (01/21/2022)   PRAPARE - Administrator, Civil Service (Medical): No    Lack of Transportation (Non-Medical): No  Physical Activity: Insufficiently Active (01/21/2022)   Exercise Vital Sign    Days of Exercise per Week: 4 days    Minutes of Exercise per Session: 30 min  Stress: No Stress Concern Present (01/21/2022)   [employer] - Occupational Stress Questionnaire    Feeling of Stress : Only a little  Social Connections: Socially Isolated (01/21/2022)   Social Connection and Isolation Panel [NHANES]    Frequency of Communication with Friends and Family: Three times a week    Frequency of Social Gatherings with Friends and Family: Three times a week    Attends Religious Services: Never    Active Member of Clubs or Organizations: No    Attends Banker Meetings: Not on file    Marital Status: Divorced  Intimate Partner Violence: Not At Risk (03/23/2020)   Humiliation, Afraid, Rape, and Kick questionnaire    Fear of Current or Ex-Partner: No    Emotionally Abused: No  Physically Abused: No    Sexually Abused: No   Family Status  Relation Name Status   Mother  Alive   Father  Deceased   MGM  Deceased   MGF  Deceased   PGF unknown (Not Specified)   PGM unknown (Not Specified)   Son  Alive   Daughter  Alive   Daughter  Alive   H Brother  Alive   H Sister  Alive   Family History  Problem Relation Age of Onset   Heart disease Mother    Depression Mother    Cervical cancer Mother    Multiple sclerosis Mother    Anxiety disorder Mother    Heart disease Father    Hypertension Maternal Grandmother    Parkinson's disease Maternal Grandmother    Dementia Maternal Grandmother    Hypertension  Maternal Grandfather    Stroke Maternal Grandfather    ADD / ADHD Son    ADD / ADHD Daughter    No Known Allergies  Patient Care Team: Erasmo Downer, MD as PCP - General (Family Medicine)   Medications: Outpatient Medications Prior to Visit  Medication Sig   cholecalciferol (VITAMIN D3) 25 MCG (1000 UNIT) tablet Take 5,000 Units by mouth daily.   cyclobenzaprine (FEXMID) 7.5 MG tablet Take 1 tablet (7.5 mg total) by mouth 3 (three) times daily as needed for muscle spasms.   fexofenadine (ALLEGRA) 180 MG tablet Take 180 mg by mouth daily.   gabapentin (NEURONTIN) 300 MG capsule Take 1 capsule (300 mg total) by mouth 3 (three) times daily.   levothyroxine (SYNTHROID) 150 MCG tablet Take 1 tablet (150 mcg total) by mouth daily before breakfast.   Multiple Vitamins-Minerals (WOMENS MULTIVITAMIN PO) Take 1 tablet by mouth 1 day or 1 dose.   norethindrone (MICRONOR) 0.35 MG tablet Take 1 tablet (0.35 mg total) by mouth daily.   sertraline (ZOLOFT) 100 MG tablet Take 1 tablet (100 mg total) by mouth daily.   No facility-administered medications prior to visit.    Review of Systems  Constitutional:  Positive for fatigue.  Eyes:  Positive for itching.  Musculoskeletal:  Positive for arthralgias and back pain.  Allergic/Immunologic: Positive for environmental allergies.  All other systems reviewed and are negative.    Objective    There were no vitals taken for this visit.    Physical Exam Vitals reviewed.  Constitutional:      General: She is not in acute distress.    Appearance: Normal appearance. She is well-developed. She is not diaphoretic.  HENT:     Head: Normocephalic and atraumatic.     Right Ear: Tympanic membrane, ear canal and external ear normal.     Left Ear: Tympanic membrane, ear canal and external ear normal.     Nose: Nose normal.     Mouth/Throat:     Mouth: Mucous membranes are moist.     Pharynx: Oropharynx is clear. No oropharyngeal exudate.   Eyes:     General: No scleral icterus.    Conjunctiva/sclera: Conjunctivae normal.     Pupils: Pupils are equal, round, and reactive to light.  Neck:     Thyroid: No thyromegaly.  Cardiovascular:     Rate and Rhythm: Normal rate and regular rhythm.     Heart sounds: Normal heart sounds. No murmur heard. Pulmonary:     Effort: Pulmonary effort is normal. No respiratory distress.     Breath sounds: Normal breath sounds. No wheezing or rales.  Abdominal:  General: There is no distension.     Palpations: Abdomen is soft.     Tenderness: There is no abdominal tenderness.  Musculoskeletal:        General: No deformity.     Cervical back: Neck supple.     Right lower leg: No edema.     Left lower leg: No edema.  Lymphadenopathy:     Cervical: No cervical adenopathy.  Skin:    General: Skin is warm and dry.     Findings: No rash.  Neurological:     Mental Status: She is alert and oriented to person, place, and time. Mental status is at baseline.     Gait: Gait normal.  Psychiatric:        Mood and Affect: Mood normal.        Behavior: Behavior normal.        Thought Content: Thought content normal.       Last depression screening scores    07/30/2022    4:13 PM 01/22/2022    3:03 PM 11/20/2021    1:07 PM  PHQ 2/9 Scores  PHQ - 2 Score 2 0 3  PHQ- 9 Score 8 0 10   Last fall risk screening    07/30/2022    4:13 PM  Fall Risk   Falls in the past year? 0  Number falls in past yr: 0  Injury with Fall? 0  Risk for fall due to : No Fall Risks  Follow up Falls evaluation completed   Last Audit-C alcohol use screening    07/30/2022    4:14 PM  Alcohol Use Disorder Test (AUDIT)  1. How often do you have a drink containing alcohol? 1  2. How many drinks containing alcohol do you have on a typical day when you are drinking? 0  3. How often do you have six or more drinks on one occasion? 0  AUDIT-C Score 1   A score of 3 or more in women, and 4 or more in men indicates  increased risk for alcohol abuse, EXCEPT if all of the points are from question 1   No results found for any visits on 01/31/23.  Assessment & Plan    Routine Health Maintenance and Physical Exam  Exercise Activities and Dietary recommendations  Goals   None     Immunization History  Administered Date(s) Administered   Influenza,inj,Quad PF,6+ Mos 07/24/2018, 06/29/2022   Moderna Sars-Covid-2 Vaccination 08/30/2020, 10/07/2020   Tdap 07/30/2022    Health Maintenance  Topic Date Due   COVID-19 Vaccine (3 - 2023-24 season) 05/25/2022   MAMMOGRAM  Never done   Zoster Vaccines- Shingrix (1 of 2) Never done   INFLUENZA VACCINE  04/25/2023   Fecal DNA (Cologuard)  11/06/2023   PAP SMEAR-Modifier  09/29/2025   DTaP/Tdap/Td (2 - Td or Tdap) 07/30/2032   Hepatitis C Screening  Completed   HIV Screening  Completed   HPV VACCINES  Aged Out    Discussed health benefits of physical activity, and encouraged her to engage in regular exercise appropriate for her age and condition.  Problem List Items Addressed This Visit       Endocrine   Hypothyroidism    Previously well controlled Continue Synthroid at current dose  Recheck TSH and adjust Synthroid as indicated        Relevant Orders   TSH     Other   Morbid obesity (HCC)    Discussed importance of healthy weight management Discussed diet  and exercise       Hyperlipidemia    Reviewed last lipid panel Not currently on a statin Recheck FLP and CMP Discussed diet and exercise       Relevant Orders   Comprehensive metabolic panel   Lipid panel   Prediabetes    Recommend low carb diet Recheck A1c       Relevant Orders   Hemoglobin A1c   Current moderate episode of major depressive disorder without prior episode (HCC)    Chronic and well controlled Continue zoloft at current dose Encourage therapy      Other Visit Diagnoses     Encounter for annual physical exam    -  Primary   Relevant Orders    Hemoglobin A1c   TSH   Comprehensive metabolic panel   Lipid panel        No follow-ups on file.     I, Shirlee Latch, MD, have reviewed all documentation for this visit. The documentation on 01/31/23 for the exam, diagnosis, procedures, and orders are all accurate and complete.   Donnajean Chesnut, Marzella Schlein, MD, MPH [facility]

## 2023-01-31 NOTE — Assessment & Plan Note (Signed)
Discussed importance of healthy weight management Discussed diet and exercise  

## 2023-01-31 NOTE — Assessment & Plan Note (Signed)
Chronic and well controlled Continue zoloft at current dose Encourage therapy 

## 2023-01-31 NOTE — Patient Instructions (Signed)
Call [facility] to schedule a mammogram [phone]  

## 2023-01-31 NOTE — Assessment & Plan Note (Signed)
Reviewed last lipid panel Not currently on a statin Recheck FLP and CMP Discussed diet and exercise  

## 2023-01-31 NOTE — Assessment & Plan Note (Signed)
Recommend low carb diet °Recheck A1c  °

## 2023-02-01 LAB — HEMOGLOBIN A1C
Est. average glucose Bld gHb Est-mCnc: 120 mg/dL
Hgb A1c MFr Bld: 5.8 % — ABNORMAL HIGH (ref 4.8–5.6)

## 2023-02-01 LAB — COMPREHENSIVE METABOLIC PANEL
ALT: 15 IU/L (ref 0–32)
AST: 19 IU/L (ref 0–40)
Albumin/Globulin Ratio: 1.5 (ref 1.2–2.2)
Albumin: 4.2 g/dL (ref 3.9–4.9)
Alkaline Phosphatase: 75 IU/L (ref 44–121)
BUN/Creatinine Ratio: 14 (ref 9–23)
BUN: 12 mg/dL (ref 6–24)
Bilirubin Total: 0.3 mg/dL (ref 0.0–1.2)
CO2: 24 mmol/L (ref 20–29)
Calcium: 9.7 mg/dL (ref 8.7–10.2)
Chloride: 103 mmol/L (ref 96–106)
Creatinine, Ser: 0.84 mg/dL (ref 0.57–1.00)
Globulin, Total: 2.8 g/dL (ref 1.5–4.5)
Glucose: 89 mg/dL (ref 70–99)
Potassium: 4.9 mmol/L (ref 3.5–5.2)
Sodium: 140 mmol/L (ref 134–144)
Total Protein: 7 g/dL (ref 6.0–8.5)
eGFR: 85 mL/min/{1.73_m2} (ref >59)

## 2023-02-01 LAB — LIPID PANEL
Chol/HDL Ratio: 6.3 ratio — ABNORMAL HIGH (ref 0.0–4.4)
Cholesterol, Total: 219 mg/dL — ABNORMAL HIGH (ref 100–199)
HDL: 35 mg/dL — ABNORMAL LOW (ref >39)
LDL Chol Calc (NIH): 154 mg/dL — ABNORMAL HIGH (ref 0–99)
Triglycerides: 163 mg/dL — ABNORMAL HIGH (ref 0–149)
VLDL Cholesterol Cal: 30 mg/dL (ref 5–40)

## 2023-02-01 LAB — TSH: TSH: 0.703 u[IU]/mL (ref 0.450–4.500)

## 2023-02-25 ENCOUNTER — Other Ambulatory Visit: Payer: Self-pay | Admitting: Family Medicine

## 2023-02-25 ENCOUNTER — Telehealth: Payer: Self-pay | Admitting: Family Medicine

## 2023-02-25 MED ORDER — GABAPENTIN 300 MG PO CAPS: 300.0000 mg | ORAL_CAPSULE | Freq: Three times a day (TID) | ORAL | 3 refills | Status: DC

## 2023-02-25 NOTE — Telephone Encounter (Signed)
[facility] pharmacy faxed refill request for the following medications:    gabapentin (NEURONTIN) 300 MG capsule    Please advise

## 2023-07-04 ENCOUNTER — Telehealth: Payer: Self-pay | Admitting: Family Medicine

## 2023-08-05 ENCOUNTER — Ambulatory Visit: Payer: 59 | Admitting: Family Medicine

## 2023-08-16 ENCOUNTER — Other Ambulatory Visit: Payer: Self-pay | Admitting: Family Medicine

## 2023-08-30 ENCOUNTER — Other Ambulatory Visit: Payer: Self-pay | Admitting: Family Medicine

## 2023-08-30 DIAGNOSIS — Z3041 Encounter for surveillance of contraceptive pills: Secondary | ICD-10-CM

## 2023-08-30 NOTE — Telephone Encounter (Signed)
[facility] faxed refill request for the following medications:    norethindrone (MICRONOR) 0.35 MG tablet   Please advise.

## 2023-09-02 MED ORDER — NORETHINDRONE 0.35 MG PO TABS: 1.0000 | ORAL_TABLET | Freq: Every day | ORAL | 3 refills | Status: DC

## 2023-10-12 ENCOUNTER — Other Ambulatory Visit: Payer: Self-pay | Admitting: Family Medicine

## 2023-10-14 NOTE — Telephone Encounter (Signed)
Pt. Has appointment. Requested Prescriptions  Pending Prescriptions Disp Refills   sertraline (ZOLOFT) 100 MG tablet [Pharmacy Med Name: SERTRALINE 100MG  TABLETS] 60 tablet 0    Sig: TAKE 1 TABLET(100 MG) BY MOUTH DAILY     Psychiatry:  Antidepressants - SSRI - sertraline Failed - 10/14/2023 11:12 AM      Failed - Valid encounter within last 6 months    Recent Outpatient Visits           8 months ago Encounter for annual physical exam   [facility] [place], Marzella Schlein, MD   1 year ago Hypothyroidism, unspecified type   [facility], Marzella Schlein, MD   1 year ago Current moderate episode of major depressive disorder without prior episode [facility])   [facility] Bacigalupo, Marzella Schlein, MD   1 year ago Prediabetes   [place] [place], Marzella Schlein, MD   2 years ago Hypothyroidism, unspecified type   [facility] [place], Marzella Schlein, MD       Future Appointments             In 1 week Bacigalupo, Marzella Schlein, MD [facility]            Passed - AST in normal range and within 360 days    AST  Date Value Ref Range Status  01/31/2023 19 0 - 40 IU/L Final         Passed - ALT in normal range and within 360 days    ALT  Date Value Ref Range Status  01/31/2023 15 0 - 32 IU/L Final         Passed - Completed PHQ-2 or PHQ-9 in the last 360 days

## 2023-10-22 ENCOUNTER — Ambulatory Visit: Payer: 59 | Admitting: Family Medicine

## 2023-10-22 ENCOUNTER — Encounter: Payer: Self-pay | Admitting: Family Medicine

## 2023-10-22 VITALS — BP 123/81 | HR 70 | Ht 65.0 in | Wt 311.7 lb

## 2023-10-22 DIAGNOSIS — F321 Major depressive disorder, single episode, moderate: Secondary | ICD-10-CM | POA: Diagnosis not present

## 2023-10-22 DIAGNOSIS — M5441 Lumbago with sciatica, right side: Secondary | ICD-10-CM

## 2023-10-22 DIAGNOSIS — Z1231 Encounter for screening mammogram for malignant neoplasm of breast: Secondary | ICD-10-CM

## 2023-10-22 DIAGNOSIS — G8929 Other chronic pain: Secondary | ICD-10-CM

## 2023-10-22 DIAGNOSIS — E785 Hyperlipidemia, unspecified: Secondary | ICD-10-CM

## 2023-10-22 DIAGNOSIS — Z1211 Encounter for screening for malignant neoplasm of colon: Secondary | ICD-10-CM

## 2023-10-22 DIAGNOSIS — M5442 Lumbago with sciatica, left side: Secondary | ICD-10-CM

## 2023-10-22 DIAGNOSIS — R7303 Prediabetes: Secondary | ICD-10-CM

## 2023-10-22 DIAGNOSIS — E039 Hypothyroidism, unspecified: Secondary | ICD-10-CM | POA: Diagnosis not present

## 2023-10-22 DIAGNOSIS — N951 Menopausal and female climacteric states: Secondary | ICD-10-CM | POA: Insufficient documentation

## 2023-10-22 MED ORDER — GABAPENTIN 300 MG PO CAPS: ORAL_CAPSULE | ORAL | 1 refills | Status: DC

## 2023-10-22 NOTE — Assessment & Plan Note (Signed)
Chronic and well controlled Continue zoloft at current dose Encourage therapy

## 2023-10-22 NOTE — Assessment & Plan Note (Signed)
Likely in perimenopause given age and amenorrhea since September. Currently on birth control, which can minimize symptoms like hot flashes. Hormone testing is not useful due to current hormone therapy. Menopause is confirmed after a full year without periods. Advised to stay on birth control to avoid unintended pregnancy (though not currently sexually active) and manage symptoms. - Continue current birth control regimen - Monitor menstrual cycle for a full year without periods to confirm menopause

## 2023-10-22 NOTE — Assessment & Plan Note (Signed)
Discussed importance of healthy weight management Discussed diet and exercise

## 2023-10-22 NOTE — Assessment & Plan Note (Signed)
Reviewed last lipid panel Not currently on a statin Recheck FLP and CMP Discussed diet and exercise

## 2023-10-22 NOTE — Assessment & Plan Note (Signed)
Reports chronic back pain affecting sleep. Currently on gabapentin 300 mg TID. Experiences morning grogginess when taking the medication late. Discussed increasing nighttime dose to improve sleep quality. Explained that initial grogginess may occur but should level off. Maximum gabapentin dose is 3600 mg/day; current dose is 900 mg/day, well below the maximum. - Increase gabapentin to 300 mg BID and 600 mg at bedtime - Reassess back pain and sleep quality after dose adjustment

## 2023-10-22 NOTE — Assessment & Plan Note (Signed)
Recommend low carb diet Recheck A1c

## 2023-10-22 NOTE — Progress Notes (Signed)
Established patient visit   Patient: Kristin Page   DOB: 06-26-1971   53 y.o. Female  MRN: [id number] Visit Date: 10/22/2023  Today's healthcare provider: Shirlee Latch, MD   Chief Complaint  Patient presents with   Medical Management of Chronic Issues   Diabetes   Hyperlipidemia   Hypertension   Hypothyroidism    Change in energy level - tired    Subjective    HPI HPI     Diabetes   Fatigue: Present.        Hyperlipidemia   The patient rarely exercises.        Hypothyroidism    Additional comments: Change in energy level - tired       Last edited by Acey Lav, CMA on 10/22/2023  1:03 PM.       Discussed the use of AI scribe software for clinical note transcription with the patient, who gave verbal consent to proceed.  History of Present Illness   The patient, with a history of back pain managed with gabapentin and mood disorders managed with Zoloft, presents with amenorrhea since September. She is currently on birth control and suspects that she might be entering menopause. She denies being sexually active and has no concerns about potential pregnancy.  In addition to the amenorrhea, the patient reports increased fatigue and difficulty sleeping due to back pain. She has been managing her back pain with gabapentin, taking one pill three times a day. However, she has been hesitant to increase the dosage due to concerns about morning grogginess.  The patient also mentions a history of thyroid issues, which she suspects might be contributing to her fatigue. She is currently on a dose of 150 micrograms for her thyroid condition.          10/22/2023   12:58 PM 01/31/2023    3:31 PM 07/30/2022    4:13 PM 01/22/2022    3:03 PM 11/20/2021    1:07 PM  Depression screen PHQ 2/9  Decreased Interest 1 1 1  0 2  Down, Depressed, Hopeless 0 0 1 0 1  PHQ - 2 Score 1 1 2  0 3  Altered sleeping 1 0 2 0 2  Tired, decreased energy 2 1 2  0 2  Change in  appetite 1 1 2  0 2  Feeling bad or failure about yourself  0 0 0 0 0  Trouble concentrating 0 0 0 0 1  Moving slowly or fidgety/restless 0 0 0 0 0  Suicidal thoughts 0 0 0 0 0  PHQ-9 Score 5 3 8  0 10  Difficult doing work/chores Not difficult at all Not difficult at all Somewhat difficult Not difficult at all Somewhat difficult        10/22/2023   12:58 PM 01/22/2022    3:04 PM  GAD 7 : Generalized Anxiety Score  Nervous, Anxious, on Edge 0 1  Control/stop worrying 0 0  Worry too much - different things 0 0  Trouble relaxing 0 0  Restless 0 0  Easily annoyed or irritable 0 0  Afraid - awful might happen 0 0  Total GAD 7 Score 0 1  Anxiety Difficulty Not difficult at all Not difficult at all      Medications: Outpatient Medications Prior to Visit  Medication Sig   cholecalciferol (VITAMIN D3) 25 MCG (1000 UNIT) tablet Take 5,000 Units by mouth daily.   fexofenadine (ALLEGRA) 180 MG tablet Take 180 mg by mouth daily.   levothyroxine (SYNTHROID) 150  MCG tablet Take 1 tablet (150 mcg total) by mouth daily before breakfast.   Multiple Vitamins-Minerals (WOMENS MULTIVITAMIN PO) Take 1 tablet by mouth 1 day or 1 dose.   norethindrone (MICRONOR) 0.35 MG tablet Take 1 tablet (0.35 mg total) by mouth daily.   sertraline (ZOLOFT) 100 MG tablet TAKE 1 TABLET(100 MG) BY MOUTH DAILY   [DISCONTINUED] gabapentin (NEURONTIN) 300 MG capsule Take 1 capsule (300 mg total) by mouth 3 (three) times daily.   No facility-administered medications prior to visit.    Review of Systems       Objective    BP 123/81 (BP Location: Left Arm, Patient Position: Sitting, Cuff Size: Normal)   Pulse 70   Ht 5\' 5"  (1.651 m)   Wt (!) 311 lb 11.2 oz (141.4 kg)   LMP 06/09/2023   SpO2 98%   BMI 51.87 kg/m    Physical Exam Vitals reviewed.  Constitutional:      General: She is not in acute distress.    Appearance: Normal appearance. She is well-developed. She is not diaphoretic.  HENT:     Head:  Normocephalic and atraumatic.  Eyes:     General: No scleral icterus.    Conjunctiva/sclera: Conjunctivae normal.  Neck:     Thyroid: No thyromegaly.  Cardiovascular:     Rate and Rhythm: Normal rate and regular rhythm.     Heart sounds: Normal heart sounds. No murmur heard. Pulmonary:     Effort: Pulmonary effort is normal. No respiratory distress.     Breath sounds: Normal breath sounds. No wheezing, rhonchi or rales.  Musculoskeletal:     Cervical back: Neck supple.     Right lower leg: No edema.     Left lower leg: No edema.  Lymphadenopathy:     Cervical: No cervical adenopathy.  Skin:    General: Skin is warm and dry.     Findings: No rash.  Neurological:     Mental Status: She is alert and oriented to person, place, and time. Mental status is at baseline.  Psychiatric:        Mood and Affect: Mood normal.        Behavior: Behavior normal.      No results found for any visits on 10/22/23.  Assessment & Plan     Problem List Items Addressed This Visit       Endocrine   Hypothyroidism - Primary   Reports increased fatigue, which could be related to thyroid function. Currently on 150 mcg of thyroid medication. Fatigue could also be related to perimenopause or back pain. - Check thyroid function with labs - Continue current thyroid medication dosage      Relevant Orders   TSH     Other   Morbid obesity (HCC)   Discussed importance of healthy weight management Discussed diet and exercise       Hyperlipidemia   Reviewed last lipid panel Not currently on a statin Recheck FLP and CMP Discussed diet and exercise       Relevant Orders   Comprehensive metabolic panel   Lipid panel   Prediabetes   Recommend low carb diet Recheck A1c       Relevant Orders   Hemoglobin A1c   Current moderate episode of major depressive disorder without prior episode (HCC)   Chronic and well controlled Continue zoloft at current dose Encourage therapy      Chronic  back pain   Reports chronic back pain affecting sleep. Currently on gabapentin  300 mg TID. Experiences morning grogginess when taking the medication late. Discussed increasing nighttime dose to improve sleep quality. Explained that initial grogginess may occur but should level off. Maximum gabapentin dose is 3600 mg/day; current dose is 900 mg/day, well below the maximum. - Increase gabapentin to 300 mg BID and 600 mg at bedtime - Reassess back pain and sleep quality after dose adjustment      Relevant Medications   gabapentin (NEURONTIN) 300 MG capsule   Perimenopausal   Likely in perimenopause given age and amenorrhea since September. Currently on birth control, which can minimize symptoms like hot flashes. Hormone testing is not useful due to current hormone therapy. Menopause is confirmed after a full year without periods. Advised to stay on birth control to avoid unintended pregnancy (though not currently sexually active) and manage symptoms. - Continue current birth control regimen - Monitor menstrual cycle for a full year without periods to confirm menopause      Other Visit Diagnoses       Breast cancer screening by mammogram       Relevant Orders   MM 3D SCREENING MAMMOGRAM BILATERAL BREAST     Colon cancer screening       Relevant Orders   Cologuard           General Health Maintenance Due for several routine health screenings and tests. - Perform Pap smear during summer physical - Order mammogram and provide contact information to schedule - Send Cologuard kit for colorectal cancer screening - Perform lab tests for cholesterol, kidney and liver function, and A1c  Follow-up - Schedule physical in the summer - Notify of lab results and mammogram scheduling.       Return in about 6 months (around 04/20/2024) for CPE.       Shirlee Latch, MD  [facility] [phone] (phone) [phone] (fax)  [facility]

## 2023-10-22 NOTE — Assessment & Plan Note (Signed)
Reports increased fatigue, which could be related to thyroid function. Currently on 150 mcg of thyroid medication. Fatigue could also be related to perimenopause or back pain. - Check thyroid function with labs - Continue current thyroid medication dosage

## 2023-10-22 NOTE — Patient Instructions (Signed)
Please contact [phone] to schedule your mammogram. You will be asked your location preference to have procedure performed. You have two options listed below.  1) [facility] located at [address] [place], [place] [address] 2) [facility] located at [address] [place], [place] [address]  Upon results being received our office will contact you. As well as all results can be viewed through your MyChart. Please feel free to contact us if you have any further questions or concerns.

## 2023-10-23 LAB — COMPREHENSIVE METABOLIC PANEL
ALT: 17 [IU]/L (ref 0–32)
AST: 23 [IU]/L (ref 0–40)
Albumin: 4.1 g/dL (ref 3.8–4.9)
Alkaline Phosphatase: 84 [IU]/L (ref 44–121)
BUN/Creatinine Ratio: 17 (ref 9–23)
BUN: 17 mg/dL (ref 6–24)
Bilirubin Total: 0.3 mg/dL (ref 0.0–1.2)
CO2: 23 mmol/L (ref 20–29)
Calcium: 9.4 mg/dL (ref 8.7–10.2)
Chloride: 104 mmol/L (ref 96–106)
Creatinine, Ser: 1.02 mg/dL — ABNORMAL HIGH (ref 0.57–1.00)
Globulin, Total: 3 g/dL (ref 1.5–4.5)
Glucose: 86 mg/dL (ref 70–99)
Potassium: 5.2 mmol/L (ref 3.5–5.2)
Sodium: 142 mmol/L (ref 134–144)
Total Protein: 7.1 g/dL (ref 6.0–8.5)
eGFR: 67 mL/min/{1.73_m2} (ref >59)

## 2023-10-23 LAB — LIPID PANEL
Chol/HDL Ratio: 5.5 {ratio} — ABNORMAL HIGH (ref 0.0–4.4)
Cholesterol, Total: 209 mg/dL — ABNORMAL HIGH (ref 100–199)
HDL: 38 mg/dL — ABNORMAL LOW (ref >39)
LDL Chol Calc (NIH): 144 mg/dL — ABNORMAL HIGH (ref 0–99)
Triglycerides: 150 mg/dL — ABNORMAL HIGH (ref 0–149)
VLDL Cholesterol Cal: 27 mg/dL (ref 5–40)

## 2023-10-23 LAB — HEMOGLOBIN A1C
Est. average glucose Bld gHb Est-mCnc: 120 mg/dL
Hgb A1c MFr Bld: 5.8 % — ABNORMAL HIGH (ref 4.8–5.6)

## 2023-10-23 LAB — TSH: TSH: 2.12 u[IU]/mL (ref 0.450–4.500)

## 2023-10-24 ENCOUNTER — Encounter: Payer: Self-pay | Admitting: Family Medicine

## 2023-11-08 ENCOUNTER — Ambulatory Visit
Admission: RE | Admit: 2023-11-08 | Discharge: 2023-11-08 | Disposition: A | Discharge status: Home / Self Care | Payer: 59 | Source: Ambulatory Visit | Attending: Family Medicine | Admitting: Family Medicine

## 2023-11-08 DIAGNOSIS — Z1231 Encounter for screening mammogram for malignant neoplasm of breast: Secondary | ICD-10-CM | POA: Diagnosis present

## 2023-11-14 ENCOUNTER — Encounter: Payer: Self-pay | Admitting: Family Medicine

## 2023-11-21 LAB — COLOGUARD: COLOGUARD: NEGATIVE

## 2023-12-22 ENCOUNTER — Other Ambulatory Visit: Payer: Self-pay | Admitting: Family Medicine

## 2023-12-24 NOTE — Telephone Encounter (Signed)
 Requested Prescriptions  Pending Prescriptions Disp Refills   sertraline (ZOLOFT) 100 MG tablet [Pharmacy Med Name: SERTRALINE 100MG  TABLETS] 90 tablet 0    Sig: TAKE 1 TABLET(100 MG) BY MOUTH DAILY     Psychiatry:  Antidepressants - SSRI - sertraline Failed - 12/24/2023  4:10 PM      Failed - Valid encounter within last 6 months    Recent Outpatient Visits   None     Future Appointments             In 3 months Bacigalupo, Marzella Schlein, MD [facility]            Passed - AST in normal range and within 360 days    AST  Date Value Ref Range Status  10/22/2023 23 0 - 40 IU/L Final         Passed - ALT in normal range and within 360 days    ALT  Date Value Ref Range Status  10/22/2023 17 0 - 32 IU/L Final         Passed - Completed PHQ-2 or PHQ-9 in the last 360 days

## 2024-01-09 ENCOUNTER — Telehealth: Payer: Self-pay | Admitting: Family Medicine

## 2024-01-09 DIAGNOSIS — E039 Hypothyroidism, unspecified: Secondary | ICD-10-CM

## 2024-01-09 NOTE — Telephone Encounter (Signed)
 [facility] pharmacy is requesting refill levothyroxine (SYNTHROID) 150 MCG tablet   Please advise

## 2024-01-10 MED ORDER — LEVOTHYROXINE SODIUM 150 MCG PO TABS: 150.0000 ug | ORAL_TABLET | Freq: Every day | ORAL | 1 refills | Status: DC

## 2024-03-24 ENCOUNTER — Other Ambulatory Visit: Payer: Self-pay | Admitting: Family Medicine

## 2024-04-12 ENCOUNTER — Other Ambulatory Visit: Payer: Self-pay | Admitting: Family Medicine

## 2024-04-16 NOTE — Telephone Encounter (Signed)
Pharmacy called to check status of refill request

## 2024-04-21 ENCOUNTER — Encounter: Payer: Self-pay | Admitting: Family Medicine

## 2024-04-21 ENCOUNTER — Other Ambulatory Visit (HOSPITAL_COMMUNITY)
Admission: RE | Admit: 2024-04-21 | Discharge: 2024-04-21 | Disposition: A | Discharge status: Home / Self Care | Source: Ambulatory Visit | Attending: Family Medicine | Admitting: Family Medicine

## 2024-04-21 ENCOUNTER — Ambulatory Visit (INDEPENDENT_AMBULATORY_CARE_PROVIDER_SITE_OTHER): Payer: 59 | Admitting: Family Medicine

## 2024-04-21 VITALS — BP 126/64 | HR 78 | Ht 65.0 in | Wt 316.0 lb

## 2024-04-21 DIAGNOSIS — E785 Hyperlipidemia, unspecified: Secondary | ICD-10-CM

## 2024-04-21 DIAGNOSIS — Z0000 Encounter for general adult medical examination without abnormal findings: Secondary | ICD-10-CM | POA: Diagnosis not present

## 2024-04-21 DIAGNOSIS — N841 Polyp of cervix uteri: Secondary | ICD-10-CM

## 2024-04-21 DIAGNOSIS — Z124 Encounter for screening for malignant neoplasm of cervix: Secondary | ICD-10-CM | POA: Diagnosis present

## 2024-04-21 DIAGNOSIS — E039 Hypothyroidism, unspecified: Secondary | ICD-10-CM | POA: Diagnosis not present

## 2024-04-21 DIAGNOSIS — F321 Major depressive disorder, single episode, moderate: Secondary | ICD-10-CM

## 2024-04-21 DIAGNOSIS — R7303 Prediabetes: Secondary | ICD-10-CM | POA: Diagnosis not present

## 2024-04-21 DIAGNOSIS — Z789 Other specified health status: Secondary | ICD-10-CM

## 2024-04-21 DIAGNOSIS — Z3041 Encounter for surveillance of contraceptive pills: Secondary | ICD-10-CM

## 2024-04-21 MED ORDER — SERTRALINE HCL 100 MG PO TABS: 100.0000 mg | ORAL_TABLET | Freq: Every day | ORAL | 1 refills | Status: DC

## 2024-04-21 MED ORDER — NORETHINDRONE 0.35 MG PO TABS: 1.0000 | ORAL_TABLET | Freq: Every day | ORAL | 3 refills | Status: AC

## 2024-04-21 NOTE — Progress Notes (Signed)
 Complete physical exam   Patient: Kristin Page   DOB: Oct 22, 1970   52 y.o. Female  MRN: [id number] Visit Date: 04/21/2024  Today's healthcare provider: Jon Eva, MD   Chief Complaint  Patient presents with   Annual Exam   Subjective    Kristin Page is a 53 y.o. female who presents today for a complete physical exam.   Discussed the use of AI scribe software for clinical note transcription with the patient, who gave verbal consent to proceed.  History of Present Illness   Kristin Page is a 53 year old female who presents for a follow-up on perimenopausal symptoms and routine health maintenance.  She experiences irregular menstrual cycles with her last full period in April and a two-day medium flow in July, with no periods since. She uses birth control for pregnancy prevention. She has right-sided cramping a couple of times a week, similar to ovulation cramps from her younger years.  She takes Zoloft  for mood management and Synthroid  150 mcg daily for thyroid management. She also takes gabapentin , recently refilled for a 90-day supply.  Her 2022 Pap smear showed ASCUS with negative high-risk HPV. She has a family history of cervical cancer, as her mother had early-stage cervical cancer in her late thirties. She completed a mammogram in February and a Cologuard test this year. She received the first dose of the shingles vaccine in June and awaits the second dose. She is open to checking her hepatitis B immunity.  She is managing her cholesterol and A1c levels by incorporating garlic into her diet and reducing eating out, opting for more home-cooked meals.        Last depression screening scores    04/21/2024    2:24 PM 10/22/2023   12:58 PM 01/31/2023    3:31 PM  PHQ 2/9 Scores  PHQ - 2 Score 2 1 1   PHQ- 9 Score 6 5 3    Last fall risk screening    04/21/2024    2:24 PM  Fall Risk   Falls in the past year? 0  Number falls in past yr: 0  Injury with  Fall? 0  Risk for fall due to : No Fall Risks  Follow up Falls evaluation completed        Medications: Outpatient Medications Prior to Visit  Medication Sig   cholecalciferol (VITAMIN D3) 25 MCG (1000 UNIT) tablet Take 5,000 Units by mouth daily.   fexofenadine (ALLEGRA) 180 MG tablet Take 180 mg by mouth daily.   gabapentin  (NEURONTIN ) 300 MG capsule TAKE 1 CAPSULE(300 MG) BY MOUTH THREE TIMES DAILY   levothyroxine  (SYNTHROID ) 150 MCG tablet Take 1 tablet (150 mcg total) by mouth daily before breakfast.   Multiple Vitamins-Minerals (WOMENS MULTIVITAMIN PO) Take 1 tablet by mouth 1 day or 1 dose.   [DISCONTINUED] norethindrone  (MICRONOR ) 0.35 MG tablet Take 1 tablet (0.35 mg total) by mouth daily.   [DISCONTINUED] sertraline  (ZOLOFT ) 100 MG tablet TAKE 1 TABLET(100 MG) BY MOUTH DAILY   No facility-administered medications prior to visit.    Review of Systems    Objective    BP 126/64   Pulse 78   Ht 5' 5 (1.651 m)   Wt (!) 316 lb (143.3 kg)   LMP 03/24/2024   SpO2 98%   BMI 52.59 kg/m    Physical Exam Vitals reviewed.  Constitutional:      General: She is not in acute distress.    Appearance: Normal appearance. She is well-developed. She is  not diaphoretic.  HENT:     Head: Normocephalic and atraumatic.     Right Ear: Tympanic membrane, ear canal and external ear normal.     Left Ear: Tympanic membrane, ear canal and external ear normal.     Nose: Nose normal.     Mouth/Throat:     Mouth: Mucous membranes are moist.     Pharynx: Oropharynx is clear. No oropharyngeal exudate.  Eyes:     General: No scleral icterus.    Conjunctiva/sclera: Conjunctivae normal.     Pupils: Pupils are equal, round, and reactive to light.  Neck:     Thyroid: No thyromegaly.  Cardiovascular:     Rate and Rhythm: Normal rate and regular rhythm.     Pulses: Normal pulses.     Heart sounds: Normal heart sounds. No murmur heard. Pulmonary:     Effort: Pulmonary effort is normal.  No respiratory distress.     Breath sounds: Normal breath sounds. No wheezing or rales.  Abdominal:     General: There is no distension.     Palpations: Abdomen is soft.     Tenderness: There is no abdominal tenderness.  Genitourinary:    Comments: GYN:  External genitalia within normal limits.  Vaginal mucosa pink, moist, normal rugae.  Nonfriable cervix without lesions, +cervical polyp, no discharge or bleeding noted on speculum exam.     Musculoskeletal:        General: No deformity.     Cervical back: Neck supple.     Right lower leg: No edema.     Left lower leg: No edema.  Lymphadenopathy:     Cervical: No cervical adenopathy.  Skin:    General: Skin is warm and dry.     Findings: No rash.  Neurological:     Mental Status: She is alert and oriented to person, place, and time. Mental status is at baseline.     Gait: Gait normal.  Psychiatric:        Mood and Affect: Mood normal.        Behavior: Behavior normal.        Thought Content: Thought content normal.      No results found for any visits on 04/21/24.  Assessment & Plan    Routine Health Maintenance and Physical Exam  Exercise Activities and Dietary recommendations  Goals   None     Immunization History  Administered Date(s) Administered   Influenza,inj,Quad PF,6+ Mos 07/24/2018, 06/29/2022   Influenza-Unspecified 06/21/2023   Moderna Sars-Covid-2 Vaccination 08/30/2020, 10/07/2020   Tdap 07/30/2022    Health Maintenance  Topic Date Due   Hepatitis B Vaccines (1 of 3 - 19+ 3-dose series) Never done   Zoster Vaccines- Shingrix (1 of 2) Never done   COVID-19 Vaccine (3 - 2024-25 season) 05/26/2023   Cervical Cancer Screening (HPV/Pap Cotest)  09/30/2023   INFLUENZA VACCINE  04/24/2024   MAMMOGRAM  11/07/2025   Fecal DNA (Cologuard)  11/15/2026   DTaP/Tdap/Td (2 - Td or Tdap) 07/30/2032   Hepatitis C Screening  Completed   HIV Screening  Completed   HPV VACCINES  Aged Out   Meningococcal B Vaccine   Aged Out    Discussed health benefits of physical activity, and encouraged her to engage in regular exercise appropriate for her age and condition.  Problem List Items Addressed This Visit       Endocrine   Hypothyroidism   On 150 mcg of Synthroid . - Check thyroid function with lab work.  Relevant Orders   TSH     Other   Morbid obesity (HCC)   Discussed importance of healthy weight management Discussed diet and exercise       Relevant Orders   TSH   Comprehensive metabolic panel with GFR   Lipid panel   Hemoglobin A1c   Hyperlipidemia   Attempting lifestyle modifications including dietary changes and garlic supplementation. Awaiting lab results to assess cholesterol levels. - Check cholesterol levels with lab work.      Relevant Orders   Comprehensive metabolic panel with GFR   Lipid panel   Prediabetes   Monitoring status with dietary changes to improve glucose levels. - Check A1c with lab work.      Relevant Orders   Hemoglobin A1c   Current moderate episode of major depressive disorder without prior episode (HCC)   Mood well-controlled on sertraline . - Continue sertraline . - Provide refills for sertraline .      Relevant Medications   sertraline  (ZOLOFT ) 100 MG tablet   Other Visit Diagnoses       Encounter for annual physical exam    -  Primary   Relevant Orders   TSH   Comprehensive metabolic panel with GFR   Lipid panel   Hemoglobin A1c   Hepatitis B Surface AntiBODY     Surveillance of previously prescribed contraceptive pill       Relevant Medications   norethindrone  (MICRONOR ) 0.35 MG tablet     Cervical cancer screening       Relevant Orders   Cytology - PAP     Hepatitis B vaccination status unknown       Relevant Orders   Hepatitis B Surface AntiBODY     Cervical polyp               Perimenopausal symptoms Experiencing irregular periods with last full period in April and a two-day bleed in July. - Continue birth control  until one year without a period.  Abnormal Pap smear with ASCUS, HPV negative Previous Pap smear showed ASCUS with negative high-risk HPV. ASCUS noted in 2017 with negative HPV. No progression, reducing concern for cervical cancer. Family history of cervical cancer. - Perform Pap smear today to monitor for any changes.  Chronic right-sided ovulatory pain Intermittent right-sided ovulatory pain consistent with ovulation. No persistent pain, reducing concern for ovarian cysts.     Cervical polyp - likely benign - if pap still abnormal, will send to GYN for further eval   Return in about 6 months (around 10/22/2024) for chronic disease f/u.     Jon Eva, MD  [facility] [phone] (phone) [phone] (fax)  [facility]

## 2024-04-21 NOTE — Assessment & Plan Note (Signed)
 Discussed importance of healthy weight management Discussed diet and exercise

## 2024-04-21 NOTE — Assessment & Plan Note (Signed)
 Monitoring status with dietary changes to improve glucose levels. - Check A1c with lab work.

## 2024-04-21 NOTE — Assessment & Plan Note (Signed)
 Attempting lifestyle modifications including dietary changes and garlic supplementation. Awaiting lab results to assess cholesterol levels. - Check cholesterol levels with lab work.

## 2024-04-21 NOTE — Assessment & Plan Note (Signed)
 Mood well-controlled on sertraline . - Continue sertraline . - Provide refills for sertraline .

## 2024-04-21 NOTE — Assessment & Plan Note (Signed)
 On 150 mcg of Synthroid . - Check thyroid function with lab work.

## 2024-04-22 ENCOUNTER — Ambulatory Visit: Payer: Self-pay | Admitting: Family Medicine

## 2024-04-22 LAB — COMPREHENSIVE METABOLIC PANEL WITH GFR
ALT: 14 IU/L (ref 0–32)
AST: 23 IU/L (ref 0–40)
Albumin: 4.2 g/dL (ref 3.8–4.9)
Alkaline Phosphatase: 86 IU/L (ref 44–121)
BUN/Creatinine Ratio: 12 (ref 9–23)
BUN: 12 mg/dL (ref 6–24)
Bilirubin Total: 0.3 mg/dL (ref 0.0–1.2)
CO2: 22 mmol/L (ref 20–29)
Calcium: 9.6 mg/dL (ref 8.7–10.2)
Chloride: 101 mmol/L (ref 96–106)
Creatinine, Ser: 1.02 mg/dL — ABNORMAL HIGH (ref 0.57–1.00)
Globulin, Total: 2.9 g/dL (ref 1.5–4.5)
Glucose: 97 mg/dL (ref 70–99)
Potassium: 4.9 mmol/L (ref 3.5–5.2)
Sodium: 140 mmol/L (ref 134–144)
Total Protein: 7.1 g/dL (ref 6.0–8.5)
eGFR: 67 mL/min/1.73 (ref >59)

## 2024-04-22 LAB — HEMOGLOBIN A1C
Est. average glucose Bld gHb Est-mCnc: 117 mg/dL
Hgb A1c MFr Bld: 5.7 % — ABNORMAL HIGH (ref 4.8–5.6)

## 2024-04-22 LAB — LIPID PANEL
Chol/HDL Ratio: 6.2 ratio — ABNORMAL HIGH (ref 0.0–4.4)
Cholesterol, Total: 217 mg/dL — ABNORMAL HIGH (ref 100–199)
HDL: 35 mg/dL — ABNORMAL LOW (ref >39)
LDL Chol Calc (NIH): 149 mg/dL — ABNORMAL HIGH (ref 0–99)
Triglycerides: 182 mg/dL — ABNORMAL HIGH (ref 0–149)
VLDL Cholesterol Cal: 33 mg/dL (ref 5–40)

## 2024-04-22 LAB — TSH: TSH: 1.83 u[IU]/mL (ref 0.450–4.500)

## 2024-04-22 LAB — HEPATITIS B SURFACE ANTIBODY,QUALITATIVE: Hep B Surface Ab, Qual: NONREACTIVE

## 2024-04-23 ENCOUNTER — Other Ambulatory Visit: Payer: Self-pay | Admitting: Family Medicine

## 2024-04-23 DIAGNOSIS — E039 Hypothyroidism, unspecified: Secondary | ICD-10-CM

## 2024-04-28 LAB — CYTOLOGY - PAP
Comment: NEGATIVE
Diagnosis: NEGATIVE
Diagnosis: REACTIVE
High risk HPV: NEGATIVE

## 2024-05-30 ENCOUNTER — Telehealth: Admitting: Family

## 2024-05-30 DIAGNOSIS — J069 Acute upper respiratory infection, unspecified: Secondary | ICD-10-CM

## 2024-05-30 MED ORDER — FLUTICASONE PROPIONATE 50 MCG/ACT NA SUSP: 2.0000 | Freq: Every day | NASAL | 6 refills | Status: AC

## 2024-05-30 MED ORDER — CETIRIZINE HCL 10 MG PO TABS: 10.0000 mg | ORAL_TABLET | Freq: Every day | ORAL | 1 refills | Status: AC

## 2024-05-30 NOTE — Progress Notes (Signed)
Approximately 5 minutes was spent documenting and reviewing patient's chart.

## 2024-05-30 NOTE — Progress Notes (Signed)

## 2024-06-18 ENCOUNTER — Telehealth: Payer: Self-pay

## 2024-06-18 ENCOUNTER — Other Ambulatory Visit: Payer: Self-pay | Admitting: Family Medicine

## 2024-06-18 MED ORDER — GABAPENTIN 300 MG PO CAPS: 300.0000 mg | ORAL_CAPSULE | Freq: Four times a day (QID) | ORAL | 1 refills | Status: DC

## 2024-06-18 NOTE — Telephone Encounter (Signed)
New Rx sent. Please let patient know.

## 2024-06-18 NOTE — Telephone Encounter (Signed)
 Copied from CRM [id number]. Topic: Clinical - Medication Question >> Jun 18, 2024 10:07 AM Roselie BROCKS wrote: Reason for CRM: gabapentin  (NEURONTIN ) 300 MG capsule, patient takes  It 4 times a day,and is running out too soon, and requesting a new written script to reflect 4 times a day.

## 2024-06-19 NOTE — Telephone Encounter (Signed)
 LVMTCB. NO Dpr available. Ok to advise if call is returned. Mychart message also sent

## 2024-06-25 NOTE — Telephone Encounter (Signed)
 Patient advised via E2C2

## 2024-06-26 NOTE — Telephone Encounter (Unsigned)
 Copied from CRM #[id number]. Topic: General - Other >> Jun 25, 2024  4:30 PM Jasmin G wrote: Reason for CRM: Pt called regarding recent missed phone call from one of Dr. Manette nurses, I contacted [facility] and relayed info told by front desk, pt states that there is a misunderstanding and that she needs to take 2 pills 3 times a day 4 if needed for his gabapentin . Please call pt back ASAP at [phone] to discuss as she states that she will run out of med soon.

## 2024-06-29 NOTE — Telephone Encounter (Signed)
 I need to know who changed it to 600mg  per dose, instead of 300mg .  We've always had her on 300mg  dose.

## 2024-06-29 NOTE — Telephone Encounter (Signed)
 Can you check with patient to see if someone changed it or she just increased it?

## 2024-06-30 MED ORDER — GABAPENTIN 300 MG PO CAPS: 600.0000 mg | ORAL_CAPSULE | Freq: Three times a day (TID) | ORAL | 5 refills | Status: AC

## 2024-06-30 NOTE — Addendum Note (Signed)
 Addended by: MYRLA JON HERO on: 06/30/2024 08:08 AM   Modules accepted: Orders

## 2024-06-30 NOTE — Telephone Encounter (Signed)
 Pt advised. Verbalized understanding.

## 2024-09-30 ENCOUNTER — Other Ambulatory Visit: Payer: Self-pay

## 2024-09-30 ENCOUNTER — Telehealth: Payer: Self-pay | Admitting: Family Medicine

## 2024-09-30 MED ORDER — SERTRALINE HCL 100 MG PO TABS: 100.0000 mg | ORAL_TABLET | Freq: Every day | ORAL | 0 refills | Status: AC

## 2024-09-30 NOTE — Telephone Encounter (Signed)
 [facility] faxed refill request for the following medications:  sertraline (ZOLOFT) 100 MG tablet   Please advise.

## 2024-09-30 NOTE — Telephone Encounter (Signed)
 Medication has been sent in for a 90 day supply.

## 2024-10-22 ENCOUNTER — Ambulatory Visit (INDEPENDENT_AMBULATORY_CARE_PROVIDER_SITE_OTHER): Admitting: Family Medicine

## 2024-10-22 ENCOUNTER — Encounter: Payer: Self-pay | Admitting: Family Medicine

## 2024-10-22 VITALS — BP 125/79 | HR 65 | Resp 16 | Ht 65.0 in | Wt 317.3 lb

## 2024-10-22 DIAGNOSIS — E785 Hyperlipidemia, unspecified: Secondary | ICD-10-CM | POA: Diagnosis not present

## 2024-10-22 DIAGNOSIS — R7303 Prediabetes: Secondary | ICD-10-CM | POA: Diagnosis not present

## 2024-10-22 DIAGNOSIS — M5441 Lumbago with sciatica, right side: Secondary | ICD-10-CM

## 2024-10-22 DIAGNOSIS — Z789 Other specified health status: Secondary | ICD-10-CM

## 2024-10-22 DIAGNOSIS — M5442 Lumbago with sciatica, left side: Secondary | ICD-10-CM

## 2024-10-22 DIAGNOSIS — F321 Major depressive disorder, single episode, moderate: Secondary | ICD-10-CM | POA: Diagnosis not present

## 2024-10-22 DIAGNOSIS — G8929 Other chronic pain: Secondary | ICD-10-CM

## 2024-10-22 DIAGNOSIS — E039 Hypothyroidism, unspecified: Secondary | ICD-10-CM | POA: Diagnosis not present

## 2024-10-22 MED ORDER — MELOXICAM 15 MG PO TABS: 15.0000 mg | ORAL_TABLET | Freq: Every day | ORAL | 2 refills | Status: AC

## 2024-10-22 MED ORDER — TOPIRAMATE 50 MG PO TABS: 50.0000 mg | ORAL_TABLET | Freq: Two times a day (BID) | ORAL | 2 refills | Status: AC

## 2024-10-22 MED ORDER — TOPIRAMATE 25 MG PO TABS: ORAL_TABLET | ORAL | 0 refills | Status: AC

## 2024-10-22 MED ORDER — PHENTERMINE HCL 37.5 MG PO CAPS: 37.5000 mg | ORAL_CAPSULE | ORAL | 2 refills | Status: AC

## 2024-10-22 NOTE — Assessment & Plan Note (Signed)
 Chronic low back pain with bilateral sciatica, partially managed with gabapentin . Reports increased pain and difficulty finding a comfortable position. Current use of dual pain relief (ibuprofen and acetaminophen) noted. Discussed potential benefit of weight loss on back pain. Meloxicam  considered for additional pain management. - Continue gabapentin  as currently prescribed. - Prescribed meloxicam , 1 tablet daily with food. - Advised against concurrent use of ibuprofen with meloxicam ; acetaminophen is permissible.

## 2024-10-22 NOTE — Assessment & Plan Note (Signed)
 Major depressive disorder, single episode, moderate. Managed with Zoloft  100 mg daily. Reports improvement in depressive symptoms and binge eating behavior. Discussed potential impact of weight loss on mood and self-image. - Continue Zoloft  100 mg daily.

## 2024-10-22 NOTE — Assessment & Plan Note (Signed)
 Prediabetes, well-managed for 4-5 years. Discussed potential impact of weight loss on glycemic control. - Ordered A1c test.

## 2024-10-22 NOTE — Progress Notes (Signed)
 "     Established patient visit   Patient: Kristin Page   DOB: 26-Aug-1971   53 y.o. Female  MRN: [id number] Visit Date: 10/22/2024  Today's healthcare provider: Jon Eva, MD   Chief Complaint  Patient presents with   Medical Management of Chronic Issues   Back Pain    Another alternative for back pain   Weight Loss    Diet: low fat, low carb Exercise: tries to exercise but due to back pain not as much. Walking only through work 5-8k steps per watch Programs: No Something to help with craving and weight loss   Subjective    HPI HPI     Back Pain    Additional comments: Another alternative for back pain        Weight Loss    Additional comments: Diet: low fat, low carb Exercise: tries to exercise but due to back pain not as much. Walking only through work 5-8k steps per watch Programs: No Something to help with craving and weight loss      Last edited by Wilfred Hargis RAMAN, CMA on 10/22/2024  3:24 PM.       Discussed the use of AI scribe software for clinical note transcription with the patient, who gave verbal consent to proceed.  History of Present Illness   Kristin Page is a 54 year old female with hyperlipidemia, prediabetes, hypothyroidism, morbid obesity, and major depressive disorder who presents for chronic disease follow-up and to discuss back pain and weight loss.  She has chronic back pain with sciatica that is not fully relieved by gabapentin  and has plateaued. Pain worsens at work and makes it hard to sit comfortably. She uses ibuprofen and Tylenol together for temporary relief.  She is concerned about weight gain despite what she sees as modest meals and wonders if weight loss could help her back pain. She has past binge eating that has been well controlled for about a year but still has strong cravings, especially around her menstrual cycle. She is trying to limit saturated fat because of rising cholesterol.  She takes Synthroid  150 mcg  daily for hypothyroidism and Zoloft  100 mg daily for depression. She takes gabapentin  for back pain, usually two doses in the morning, one in the afternoon, and sometimes a fourth at bedtime. She notices cognitive slowing and word-finding difficulty on gabapentin  and has poor sleep when she skips doses on days off.           10/22/2024    3:27 PM 04/21/2024    2:24 PM 10/22/2023   12:58 PM 01/22/2022    3:04 PM  GAD 7 : Generalized Anxiety Score  Nervous, Anxious, on Edge 0 0  0  1   Control/stop worrying 0 0  0  0   Worry too much - different things 0 0  0  0   Trouble relaxing 0 0  0  0   Restless 0 0  0  0   Easily annoyed or irritable 1 1  0  0   Afraid - awful might happen 0 0  0  0   Total GAD 7 Score 1 1 0 1  Anxiety Difficulty Not difficult at all Not difficult at all Not difficult at all Not difficult at all     Data saved with a previous flowsheet row definition        10/22/2024    3:27 PM 04/21/2024    2:24 PM 10/22/2023   12:58 PM 01/31/2023  3:31 PM 07/30/2022    4:13 PM  Depression screen PHQ 2/9  Decreased Interest 1 1 1 1 1   Down, Depressed, Hopeless 1 1 0 0 1  PHQ - 2 Score 2 2 1 1 2   Altered sleeping 1 0 1 0 2  Tired, decreased energy 3 2 2 1 2   Change in appetite 2 1 1 1 2   Feeling bad or failure about yourself  0 0 0 0 0  Trouble concentrating 1 1 0 0 0  Moving slowly or fidgety/restless 0 0 0 0 0  Suicidal thoughts 0 0 0 0 0  PHQ-9 Score 9 6  5  3  8    Difficult doing work/chores Somewhat difficult Not difficult at all Not difficult at all Not difficult at all Somewhat difficult     Data saved with a previous flowsheet row definition    Medications: Show/hide medication list[1]  Review of Systems     Objective    BP 125/79   Pulse 65   Resp 16   Ht 5' 5 (1.651 m)   Wt (!) 317 lb 4.8 oz (143.9 kg)   LMP 09/21/2024   SpO2 100%   BMI 52.80 kg/m    Physical Exam Vitals reviewed.  Constitutional:      General: She is not in acute  distress.    Appearance: Normal appearance. She is well-developed. She is not diaphoretic.  HENT:     Head: Normocephalic and atraumatic.  Eyes:     General: No scleral icterus.    Conjunctiva/sclera: Conjunctivae normal.  Neck:     Thyroid: No thyromegaly.  Cardiovascular:     Rate and Rhythm: Normal rate and regular rhythm.     Heart sounds: Normal heart sounds. No murmur heard. Pulmonary:     Effort: Pulmonary effort is normal. No respiratory distress.     Breath sounds: Normal breath sounds. No wheezing, rhonchi or rales.  Musculoskeletal:     Cervical back: Neck supple.     Right lower leg: No edema.     Left lower leg: No edema.  Lymphadenopathy:     Cervical: No cervical adenopathy.  Skin:    General: Skin is warm and dry.     Findings: No rash.  Neurological:     Mental Status: She is alert and oriented to person, place, and time. Mental status is at baseline.  Psychiatric:        Mood and Affect: Mood normal.        Behavior: Behavior normal.      No results found for any visits on 10/22/24.  Assessment & Plan     Problem List Items Addressed This Visit       Endocrine   Hypothyroidism - Primary   Managed with Synthroid  150 mcg daily. - Continue Synthroid  150 mcg daily. - Ordered thyroid function tests.      Relevant Orders   TSH     Other   Morbid obesity (HCC)   Morbid obesity with associated binge eating disorder. Reports difficulty losing weight despite dietary efforts. Phentermine  discussed but noted for potential yo-yo effect and limited duration of use. Topamax  considered for long-term management of binge eating and weight loss. Discussed potential side effects of Topamax , including drowsiness, dry mouth, and cognitive slowing at higher doses. Insurance coverage for injectable weight loss medications discussed, with emphasis on prediabetes status. - Prescribed phentermine  for 3 months, 1 pill daily in the morning. - Prescribed Topamax , starting  with 25  mg twice daily for 2 weeks, then increase to 50 mg twice daily. - Instructed to check insurance coverage for injectable weight loss medications (Wegovy and Ozempic). - Encouraged dietary modifications and exercise as part of weight loss strategy.      Relevant Medications   phentermine  37.5 MG capsule   Hyperlipidemia   Reports efforts to reduce saturated fat intake.      Relevant Orders   Comprehensive metabolic panel with GFR   Lipid panel   Prediabetes   Prediabetes, well-managed for 4-5 years. Discussed potential impact of weight loss on glycemic control. - Ordered A1c test.      Relevant Orders   Hemoglobin A1c   Current moderate episode of major depressive disorder without prior episode (HCC)   Major depressive disorder, single episode, moderate. Managed with Zoloft  100 mg daily. Reports improvement in depressive symptoms and binge eating behavior. Discussed potential impact of weight loss on mood and self-image. - Continue Zoloft  100 mg daily.       Chronic back pain   Chronic low back pain with bilateral sciatica, partially managed with gabapentin . Reports increased pain and difficulty finding a comfortable position. Current use of dual pain relief (ibuprofen and acetaminophen) noted. Discussed potential benefit of weight loss on back pain. Meloxicam  considered for additional pain management. - Continue gabapentin  as currently prescribed. - Prescribed meloxicam , 1 tablet daily with food. - Advised against concurrent use of ibuprofen with meloxicam ; acetaminophen is permissible.      Relevant Medications   topiramate  (TOPAMAX ) 25 MG tablet   topiramate  (TOPAMAX ) 50 MG tablet   meloxicam  (MOBIC ) 15 MG tablet   Other Visit Diagnoses       Hepatitis B vaccination status unknown       Relevant Orders   Hepatitis B Surface AntiBODY       Return in about 3 months (around 01/20/2025) for chronic disease f/u, weight f/u.       Jon Eva, MD  [facility] [phone] (phone) [phone] (fax)  [facility]      [1]  Outpatient Medications Prior to Visit  Medication Sig   cetirizine  (ZYRTEC  ALLERGY) 10 MG tablet Take 1 tablet (10 mg total) by mouth daily.   cholecalciferol (VITAMIN D3) 25 MCG (1000 UNIT) tablet Take 5,000 Units by mouth daily.   fluticasone  (FLONASE ) 50 MCG/ACT nasal spray Place 2 sprays into both nostrils daily.   gabapentin  (NEURONTIN ) 300 MG capsule Take 2 capsules (600 mg total) by mouth 3 (three) times daily.   levothyroxine  (SYNTHROID ) 150 MCG tablet TAKE 1 TABLET(150 MCG) BY MOUTH DAILY BEFORE BREAKFAST   Multiple Vitamins-Minerals (WOMENS MULTIVITAMIN PO) Take 1 tablet by mouth 1 day or 1 dose.   norethindrone  (MICRONOR ) 0.35 MG tablet Take 1 tablet (0.35 mg total) by mouth daily.   sertraline  (ZOLOFT ) 100 MG tablet Take 1 tablet (100 mg total) by mouth daily.   [DISCONTINUED] fexofenadine (ALLEGRA) 180 MG tablet Take 180 mg by mouth daily.   No facility-administered medications prior to visit.   "

## 2024-10-22 NOTE — Assessment & Plan Note (Signed)
 Morbid obesity with associated binge eating disorder. Reports difficulty losing weight despite dietary efforts. Phentermine  discussed but noted for potential yo-yo effect and limited duration of use. Topamax  considered for long-term management of binge eating and weight loss. Discussed potential side effects of Topamax , including drowsiness, dry mouth, and cognitive slowing at higher doses. Insurance coverage for injectable weight loss medications discussed, with emphasis on prediabetes status. - Prescribed phentermine  for 3 months, 1 pill daily in the morning. - Prescribed Topamax , starting with 25 mg twice daily for 2 weeks, then increase to 50 mg twice daily. - Instructed to check insurance coverage for injectable weight loss medications (Wegovy and Ozempic). - Encouraged dietary modifications and exercise as part of weight loss strategy.

## 2024-10-22 NOTE — Assessment & Plan Note (Signed)
 Managed with Synthroid  150 mcg daily. - Continue Synthroid  150 mcg daily. - Ordered thyroid function tests.

## 2024-10-22 NOTE — Assessment & Plan Note (Signed)
 Reports efforts to reduce saturated fat intake.

## 2024-10-23 ENCOUNTER — Telehealth: Payer: Self-pay | Admitting: Family Medicine

## 2024-10-23 LAB — COMPREHENSIVE METABOLIC PANEL WITH GFR
ALT: 15 [IU]/L (ref 0–32)
AST: 23 [IU]/L (ref 0–40)
Albumin: 4.4 g/dL (ref 3.8–4.9)
Alkaline Phosphatase: 80 [IU]/L (ref 49–135)
BUN/Creatinine Ratio: 14 (ref 9–23)
BUN: 15 mg/dL (ref 6–24)
Bilirubin Total: 0.3 mg/dL (ref 0.0–1.2)
CO2: 25 mmol/L (ref 20–29)
Calcium: 9.4 mg/dL (ref 8.7–10.2)
Chloride: 103 mmol/L (ref 96–106)
Creatinine, Ser: 1.05 mg/dL — ABNORMAL HIGH (ref 0.57–1.00)
Globulin, Total: 2.9 g/dL (ref 1.5–4.5)
Glucose: 89 mg/dL (ref 70–99)
Potassium: 5.2 mmol/L (ref 3.5–5.2)
Sodium: 142 mmol/L (ref 134–144)
Total Protein: 7.3 g/dL (ref 6.0–8.5)
eGFR: 64 mL/min/{1.73_m2}

## 2024-10-23 LAB — HEMOGLOBIN A1C
Est. average glucose Bld gHb Est-mCnc: 114 mg/dL
Hgb A1c MFr Bld: 5.6 % (ref 4.8–5.6)

## 2024-10-23 LAB — TSH: TSH: 1.12 u[IU]/mL (ref 0.450–4.500)

## 2024-10-23 LAB — LIPID PANEL
Chol/HDL Ratio: 5.4 ratio — ABNORMAL HIGH (ref 0.0–4.4)
Cholesterol, Total: 205 mg/dL — ABNORMAL HIGH (ref 100–199)
HDL: 38 mg/dL — ABNORMAL LOW
LDL Chol Calc (NIH): 134 mg/dL — ABNORMAL HIGH (ref 0–99)
Triglycerides: 181 mg/dL — ABNORMAL HIGH (ref 0–149)
VLDL Cholesterol Cal: 33 mg/dL (ref 5–40)

## 2024-10-23 LAB — HEPATITIS B SURFACE ANTIBODY,QUALITATIVE: Hep B Surface Ab, Qual: NONREACTIVE

## 2024-10-23 NOTE — Telephone Encounter (Signed)
 [facility] faxed refill request for the following medications:  topiramate  (TOPAMAX ) 25 MG tablet     Please advise.

## 2024-10-23 NOTE — Telephone Encounter (Signed)
 Medication was sent in yesterday, during office visit.

## 2024-10-26 ENCOUNTER — Ambulatory Visit: Payer: Self-pay | Admitting: Family Medicine

## 2025-01-21 ENCOUNTER — Ambulatory Visit: Admitting: Family Medicine
